# Patient Record
Sex: Male | Born: 1985 | Race: White | Hispanic: No | Marital: Married | Smoking: Never smoker
Health system: Southern US, Community
[De-identification: ages and names within clinical notes are randomized; demographics above are authoritative.]

## PROBLEM LIST (undated history)

## (undated) DIAGNOSIS — I10 Essential (primary) hypertension: Secondary | ICD-10-CM

## (undated) HISTORY — DX: Essential (primary) hypertension: I10

---

## 2008-07-28 ENCOUNTER — Ambulatory Visit: Payer: Self-pay | Admitting: Family Medicine

## 2011-09-10 ENCOUNTER — Emergency Department: Payer: Self-pay | Admitting: Emergency Medicine

## 2011-09-22 ENCOUNTER — Ambulatory Visit: Payer: Self-pay | Admitting: Unknown Physician Specialty

## 2012-10-10 IMAGING — CR DG KNEE COMPLETE 4+V*R*
1 series · 5 of 5 positions shown · non-contrast
Comparison: none

REASON FOR EXAM: pain after injury  -  ed waiting room
COMMENTS:   May transport without cardiac monitor

PROCEDURE:     DXR - DXR KNEE RT COMP WITH OBLIQUES  - September 11, 2011 [DATE]
RESULT:     Five views of the right knee reveal the bones to be adequately
mineralized. I do not see evidence of an acute fracture nor dislocation.
There is soft tissue swelling in the prepatellar region.

[Series 1: t knee ap right · 0.14mm/px · 5 of 5 slices shown]
[im 1/5]
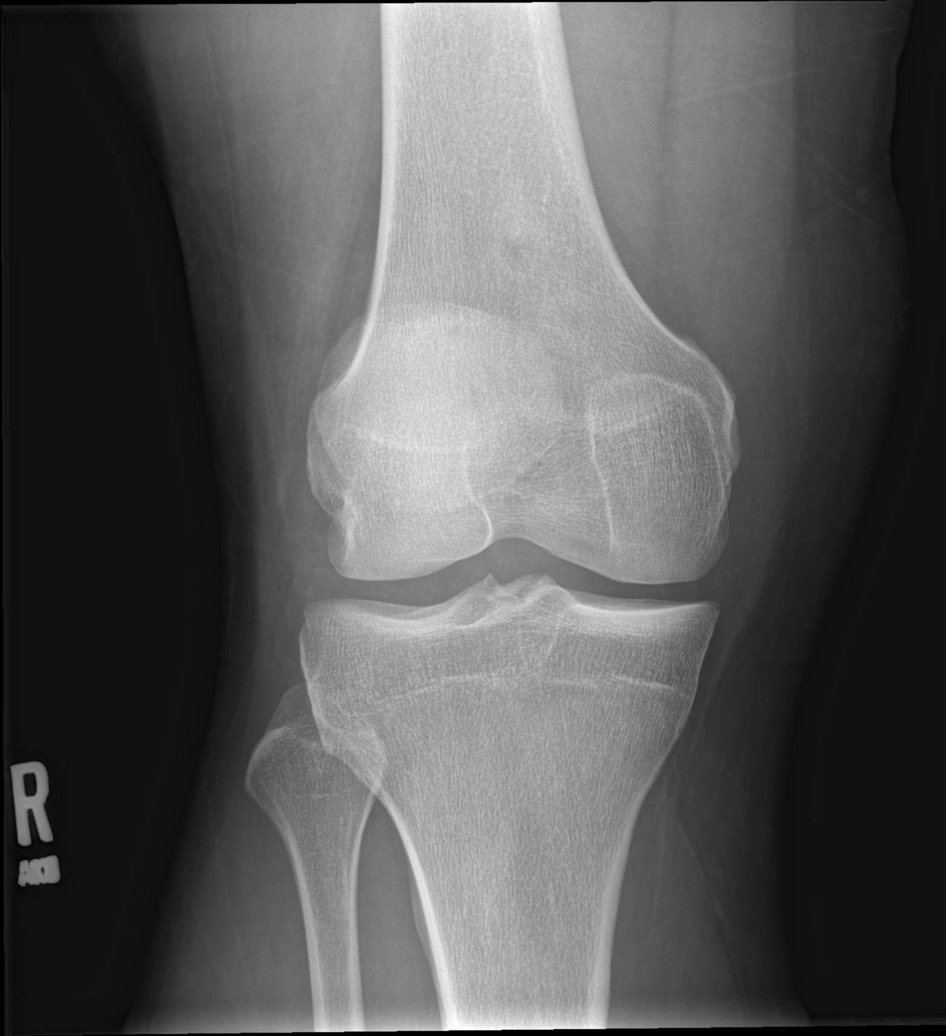
[im 2/5]
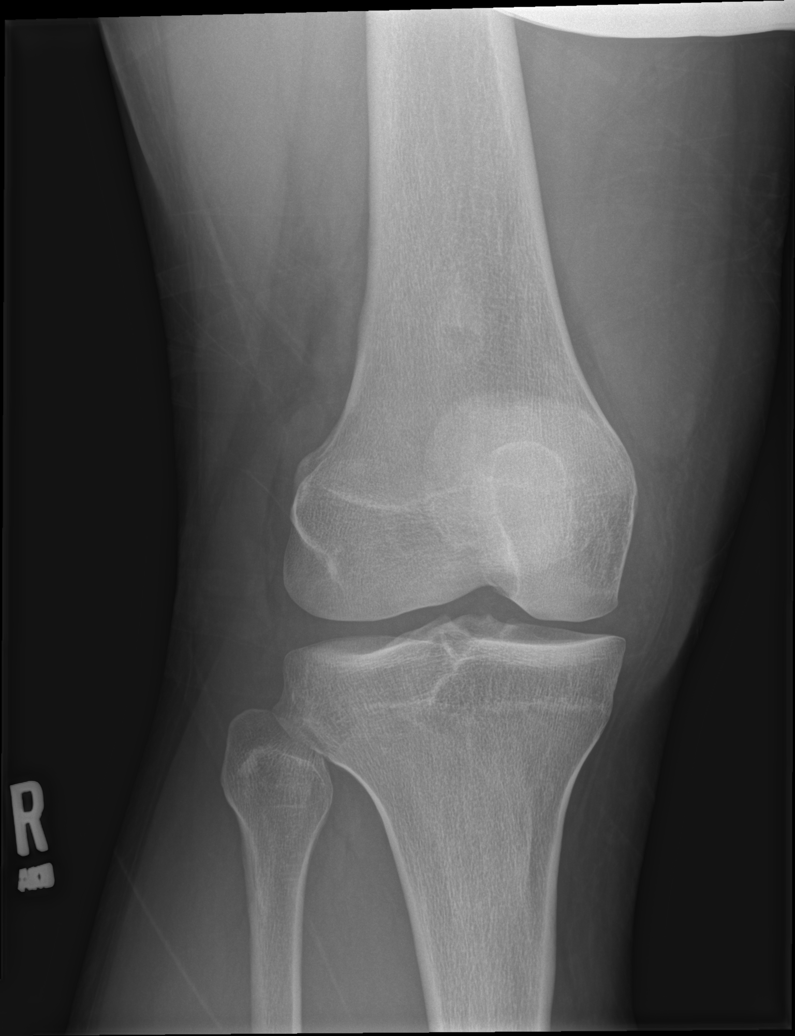
[im 3/5]
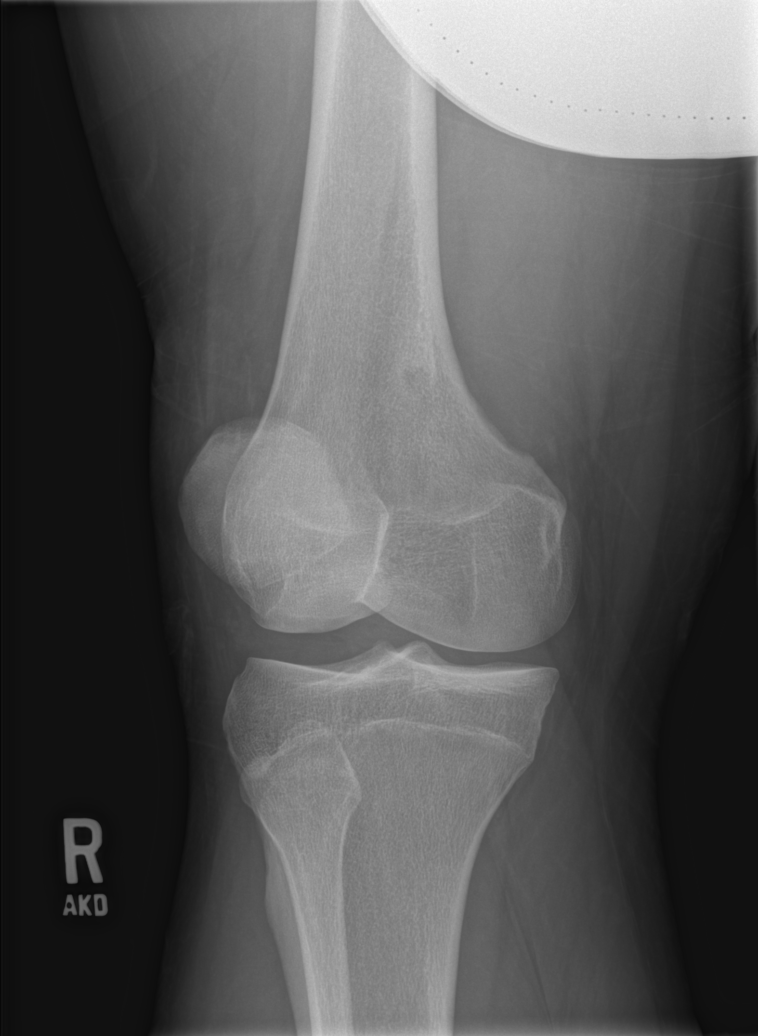
[im 4/5]
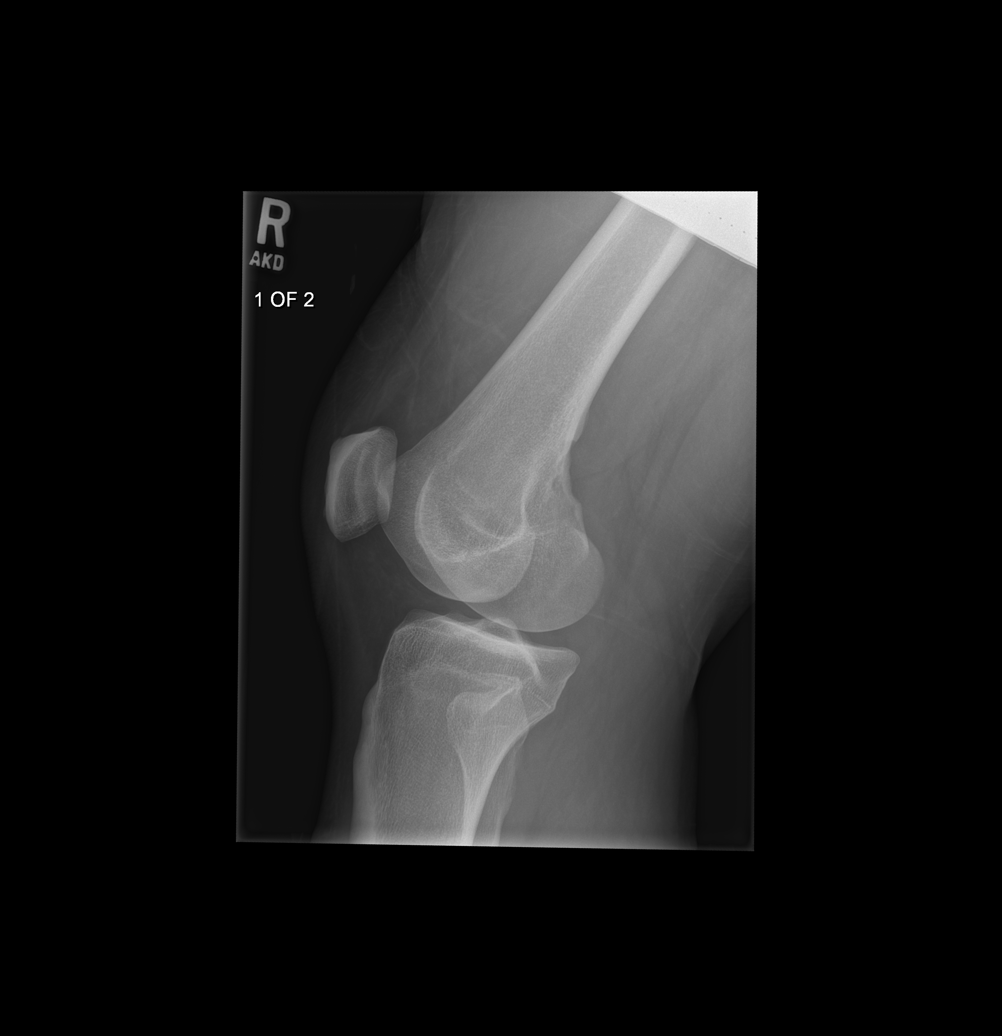
[im 5/5]
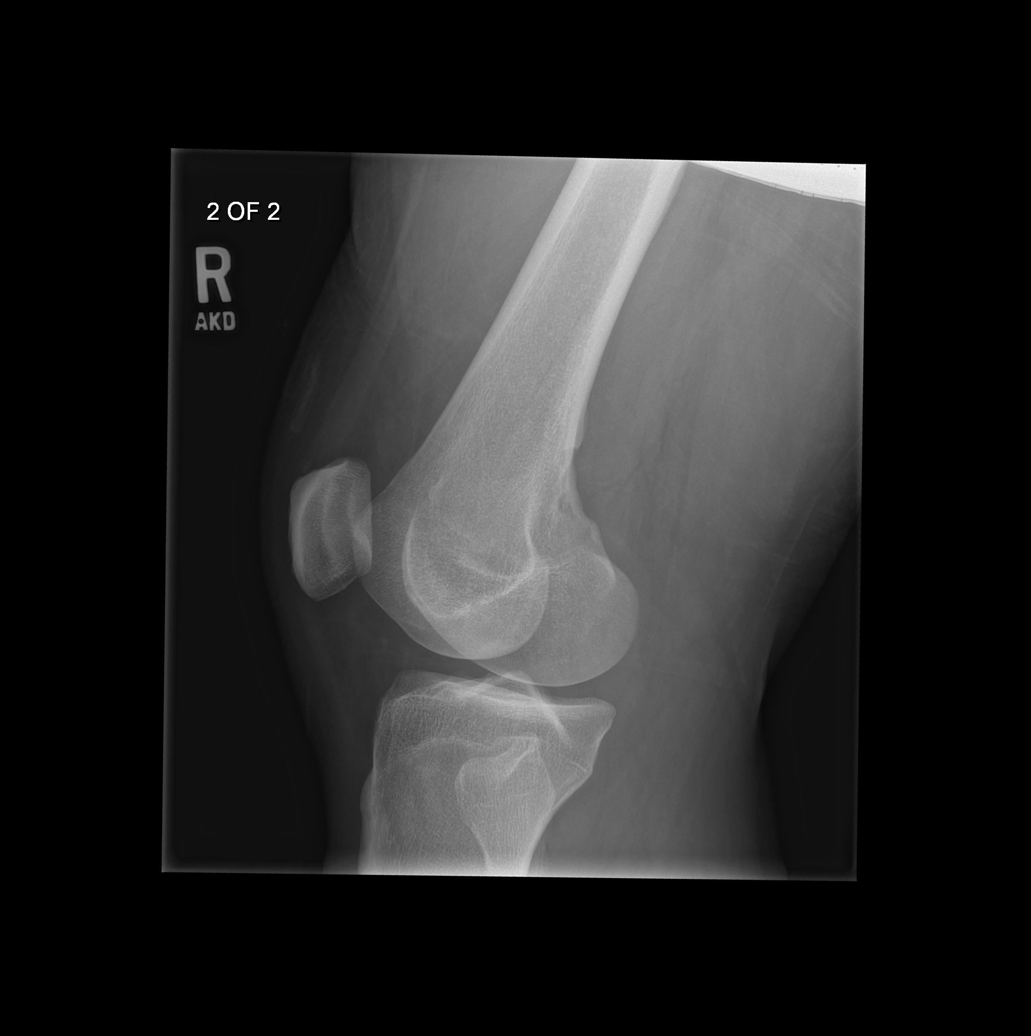

[5 of 5 positions shown; findings below may reference images not displayed]

IMPRESSION: I do not see evidence of an acute fracture nor dislocation
of the right knee.
 [REDACTED].

## 2021-02-05 DIAGNOSIS — L7211 Pilar cyst: Secondary | ICD-10-CM | POA: Diagnosis not present

## 2021-02-05 DIAGNOSIS — L730 Acne keloid: Secondary | ICD-10-CM | POA: Diagnosis not present

## 2021-08-06 DIAGNOSIS — B351 Tinea unguium: Secondary | ICD-10-CM | POA: Diagnosis not present

## 2021-08-06 DIAGNOSIS — L730 Acne keloid: Secondary | ICD-10-CM | POA: Diagnosis not present

## 2021-08-06 DIAGNOSIS — B353 Tinea pedis: Secondary | ICD-10-CM | POA: Diagnosis not present

## 2021-10-26 ENCOUNTER — Other Ambulatory Visit: Payer: Self-pay

## 2021-10-27 ENCOUNTER — Other Ambulatory Visit: Payer: Self-pay

## 2021-10-29 ENCOUNTER — Other Ambulatory Visit: Payer: Self-pay

## 2021-10-29 MED ORDER — TELMISARTAN 40 MG PO TABS
ORAL_TABLET | ORAL | 0 refills | Status: DC
  Filled 2021-10-29: qty 30, 30d supply, fill #0

## 2021-10-29 MED ORDER — CLINDAMYCIN PHOSPHATE 1 % EX SOLN
CUTANEOUS | 6 refills | Status: AC
  Filled 2021-10-29: qty 60, 20d supply, fill #0

## 2021-11-01 ENCOUNTER — Other Ambulatory Visit: Payer: Self-pay

## 2021-11-26 ENCOUNTER — Other Ambulatory Visit: Payer: Self-pay

## 2021-11-26 MED ORDER — TELMISARTAN 40 MG PO TABS
40.0000 mg | ORAL_TABLET | Freq: Every day | ORAL | 1 refills | Status: DC
  Filled 2021-11-26: qty 30, 30d supply, fill #0
  Filled 2021-12-25: qty 30, 30d supply, fill #1

## 2021-11-26 MED ORDER — HYDROCHLOROTHIAZIDE 12.5 MG PO TABS
12.5000 mg | ORAL_TABLET | Freq: Every day | ORAL | 2 refills | Status: DC
  Filled 2021-11-26: qty 30, 30d supply, fill #0
  Filled 2021-12-25: qty 30, 30d supply, fill #1

## 2021-12-26 ENCOUNTER — Other Ambulatory Visit: Payer: Self-pay

## 2021-12-27 ENCOUNTER — Other Ambulatory Visit: Payer: Self-pay

## 2022-01-21 ENCOUNTER — Other Ambulatory Visit: Payer: Self-pay

## 2022-01-23 ENCOUNTER — Other Ambulatory Visit: Payer: Self-pay

## 2022-01-24 ENCOUNTER — Other Ambulatory Visit: Payer: Self-pay

## 2022-01-25 ENCOUNTER — Other Ambulatory Visit: Payer: Self-pay

## 2022-02-01 ENCOUNTER — Other Ambulatory Visit: Payer: Self-pay

## 2022-02-03 ENCOUNTER — Other Ambulatory Visit: Payer: Self-pay

## 2022-02-04 ENCOUNTER — Other Ambulatory Visit: Payer: Self-pay

## 2022-02-09 ENCOUNTER — Other Ambulatory Visit: Payer: Self-pay

## 2022-02-21 ENCOUNTER — Other Ambulatory Visit: Payer: Self-pay

## 2022-02-27 ENCOUNTER — Other Ambulatory Visit: Payer: Self-pay

## 2022-03-01 ENCOUNTER — Other Ambulatory Visit: Payer: Self-pay

## 2022-03-08 ENCOUNTER — Other Ambulatory Visit: Payer: Self-pay

## 2022-03-09 ENCOUNTER — Other Ambulatory Visit: Payer: Self-pay

## 2022-07-20 ENCOUNTER — Ambulatory Visit: Payer: Self-pay | Admitting: Nurse Practitioner

## 2022-07-21 NOTE — Progress Notes (Signed)
BP (!) 142/96   Pulse 82   Temp 98.1 F (36.7 C) (Oral)   Resp 18   Ht 6' (1.829 m)   Wt 297 lb 8 oz (134.9 kg)   SpO2 94%   BMI 40.35 kg/m    Subjective:    Patient ID: Brett Burke, male    DOB: 01-06-1986, 37 y.o.   MRN: [id number]  HPI: Brett Burke is a 37 y.o. male  Chief Complaint  Patient presents with   Establish Care   Hypertension    Running 140/90, out of meds for 4 months   Insomnia   Headache   Establish care: his last physical was last year.  Medical history includes HTN, hyperlipidemia.  Family history includes aplastic anemia, bone marrow cancer, prostate cancer, skin cancer, dementia.  Health Maintenance due for labs.   Hypertension: His blood pressure today is 142/96.  He says that he has been getting high readings at home.  188/116, 175/125, 185/122, 197/142. He reports he has had some blurred vision, headaches, exertional shortness of breath.  He says that he has been out of blood pressure medication for three months. He says that when he is on medication he does not have those symptoms.  Will restart medication.    Hyperlipidemia: his last LDL was 182 on 10/25/2021. We are going to recheck labs.    Headache: he says that he started been having headaches. He says that it started late last year. He says initially it was after having sex.  And then he started getting them in the middle of the day.  He says he would take a BC powder and it does help.  He describes the headache as extreme and across the top of his head. He does experience light sensitivity, no nausea.  He says the headache can last a couple hours.  He says he has 2-3 in the last month. Ordered head ct.   Urinary frequency/urgency/increased thirst:  he says that since January he has had these symptoms. Will get labs. Urine dip was positive for glucose.  A1C was 11.8.  New onset diabetes: A1C was 11.8. discussed treatment options.  Patient started on ozempic 0.25 mg weekly, first dose given in  office. Will also start metformin 500 mg BID.  Ordered glucometer to monitor blood sugar three times a week.  Follow up in three months. Also discussed ways to help with lifestyle modification.   Tingling in hands: patient reports he has had tingling in bilateral hands after sleeping.  Will get labs, discussed trying wrist splints.        07/22/2022   10:07 AM  Depression screen PHQ 2/9  Decreased Interest 0  Down, Depressed, Hopeless 0  PHQ - 2 Score 0    Relevant past medical, surgical, family and social history reviewed and updated as indicated. Interim medical history since our last visit reviewed. Allergies and medications reviewed and updated.  Review of Systems  Constitutional: Negative for fever or weight change.  Respiratory: Negative for cough and shortness of breath.   Cardiovascular: Negative for chest pain or palpitations.  Gastrointestinal: Negative for abdominal pain, no bowel changes.  GU: positive urinary frequency, urgency Musculoskeletal: Negative for gait problem or joint swelling.  Skin: Negative for rash.  Neurological: Negative for dizziness , positive for  headache.  No other specific complaints in a complete review of systems (except as listed in HPI above).      Objective:    BP (!) 142/96  Pulse 82   Temp 98.1 F (36.7 C) (Oral)   Resp 18   Ht 6' (1.829 m)   Wt 297 lb 8 oz (134.9 kg)   SpO2 94%   BMI 40.35 kg/m   Wt Readings from Last 3 Encounters:  07/22/22 297 lb 8 oz (134.9 kg)    Physical Exam  Constitutional: Patient appears well-developed and well-nourished. Obese  No distress.  HEENT: head atraumatic, normocephalic, pupils equal and reactive to light, neck supple Cardiovascular: Normal rate, regular rhythm and normal heart sounds.  No murmur heard. No BLE edema. Pulmonary/Chest: Effort normal and breath sounds normal. No respiratory distress. Abdominal: Soft.  There is no tenderness. Psychiatric: Patient has a normal mood and  affect. behavior is normal. Judgment and thought content normal.     Assessment & Plan:   Problem List Items Addressed This Visit       Cardiovascular and Mediastinum   Essential hypertension    Restart telmisartan-hydrochlorothiazide 40-12.5 mg daily       Relevant Medications   telmisartan-hydrochlorothiazide (MICARDIS HCT) 40-12.5 MG tablet   Other Relevant Orders   CBC with Differential/Platelet   COMPLETE METABOLIC PANEL WITH GFR     Endocrine   New onset type 2 diabetes mellitus (HCC)    Start metformin 500 mg BID and ozempic 0.25 mg weekly.  Check blood sugar three times a week and keep log bring to next appointment. Work on lifestyle modification.       Relevant Medications   telmisartan-hydrochlorothiazide (MICARDIS HCT) 40-12.5 MG tablet   Semaglutide, 1 MG/DOSE, 4 MG/3ML SOPN (Start on 09/21/2022)   Semaglutide,0.25 or 0.5MG /DOS, (OZEMPIC, 0.25 OR 0.5 MG/DOSE,) 2 MG/3ML SOPN   metFORMIN (GLUCOPHAGE-XR) 500 MG 24 hr tablet   Blood Glucose Monitoring Suppl (BLOOD GLUCOSE MONITOR SYSTEM) w/Device KIT   Glucose Blood (BLOOD GLUCOSE TEST STRIPS) STRP   Lancet Device MISC   Lancets (FREESTYLE) lancets     Other   Mixed hyperlipidemia - Primary    Getting labs, discussed lifestyle modification      Relevant Medications   telmisartan-hydrochlorothiazide (MICARDIS HCT) 40-12.5 MG tablet   Other Relevant Orders   Lipid panel   Other Visit Diagnoses     Need for Tdap vaccination       Relevant Orders   Tdap vaccine greater than or equal to 7yo IM (Completed)   Encounter to establish care       schedule cpe   Screening for diabetes mellitus       Relevant Orders   COMPLETE METABOLIC PANEL WITH GFR   POCT HgB A1C (Completed)   Screening for HIV without presence of risk factors       Relevant Orders   HIV Antibody (routine testing w rflx)   Encounter for hepatitis C screening test for low risk patient       Relevant Orders   Hepatitis C antibody   Headache  associated with sexual activity       ordered head ct   Relevant Orders   CT HEAD WO CONTRAST (5MM)   Paresthesia of both hands       getting labs, can use wrist splints at bed time   Relevant Orders   Vitamin B12   Increased frequency of urination       getting labs, A1C was 11.8, new onset diabetes   Relevant Orders   COMPLETE METABOLIC PANEL WITH GFR   POCT urinalysis dipstick (Completed)        Follow up plan:  Return in about 3 months (around 10/22/2022) for follow up.

## 2022-07-22 ENCOUNTER — Ambulatory Visit: Payer: Commercial Managed Care - PPO | Admitting: Nurse Practitioner

## 2022-07-22 ENCOUNTER — Encounter: Payer: Self-pay | Admitting: Nurse Practitioner

## 2022-07-22 ENCOUNTER — Other Ambulatory Visit: Payer: Self-pay

## 2022-07-22 VITALS — BP 142/96 | HR 82 | Temp 98.1°F | Resp 18 | Ht 72.0 in | Wt 297.5 lb

## 2022-07-22 DIAGNOSIS — R202 Paresthesia of skin: Secondary | ICD-10-CM

## 2022-07-22 DIAGNOSIS — I10 Essential (primary) hypertension: Secondary | ICD-10-CM | POA: Diagnosis not present

## 2022-07-22 DIAGNOSIS — Z114 Encounter for screening for human immunodeficiency virus [HIV]: Secondary | ICD-10-CM

## 2022-07-22 DIAGNOSIS — R350 Frequency of micturition: Secondary | ICD-10-CM | POA: Diagnosis not present

## 2022-07-22 DIAGNOSIS — Z7689 Persons encountering health services in other specified circumstances: Secondary | ICD-10-CM

## 2022-07-22 DIAGNOSIS — G4482 Headache associated with sexual activity: Secondary | ICD-10-CM

## 2022-07-22 DIAGNOSIS — E1165 Type 2 diabetes mellitus with hyperglycemia: Secondary | ICD-10-CM | POA: Insufficient documentation

## 2022-07-22 DIAGNOSIS — E782 Mixed hyperlipidemia: Secondary | ICD-10-CM

## 2022-07-22 DIAGNOSIS — Z1159 Encounter for screening for other viral diseases: Secondary | ICD-10-CM | POA: Diagnosis not present

## 2022-07-22 DIAGNOSIS — Z23 Encounter for immunization: Secondary | ICD-10-CM | POA: Diagnosis not present

## 2022-07-22 DIAGNOSIS — Z131 Encounter for screening for diabetes mellitus: Secondary | ICD-10-CM | POA: Diagnosis not present

## 2022-07-22 DIAGNOSIS — E119 Type 2 diabetes mellitus without complications: Secondary | ICD-10-CM | POA: Insufficient documentation

## 2022-07-22 LAB — POCT URINALYSIS DIPSTICK
Bilirubin, UA: NEGATIVE
Blood, UA: NEGATIVE
Glucose, UA: POSITIVE — AB
Leukocytes, UA: NEGATIVE
Nitrite, UA: NEGATIVE
Protein, UA: NEGATIVE
Spec Grav, UA: 1.02 (ref 1.010–1.025)
Urobilinogen, UA: 0.2 E.U./dL
pH, UA: 5 (ref 5.0–8.0)

## 2022-07-22 LAB — POCT GLYCOSYLATED HEMOGLOBIN (HGB A1C): Hemoglobin A1C: 11.8 % — AB (ref 4.0–5.6)

## 2022-07-22 MED ORDER — BLOOD GLUCOSE MONITOR SYSTEM W/DEVICE KIT
1.0000 | PACK | Freq: Three times a day (TID) | 0 refills | Status: AC
  Filled 2022-07-22: qty 1, 1d supply, fill #0

## 2022-07-22 MED ORDER — TELMISARTAN-HCTZ 40-12.5 MG PO TABS
1.0000 | ORAL_TABLET | Freq: Every day | ORAL | 1 refills | Status: DC
  Filled 2022-07-22 (×2): qty 90, 90d supply, fill #0
  Filled 2022-10-11: qty 90, 90d supply, fill #1

## 2022-07-22 MED ORDER — LANCET DEVICE MISC
1.0000 | Freq: Three times a day (TID) | 0 refills | Status: AC
  Filled 2022-07-22: qty 1, 30d supply, fill #0

## 2022-07-22 MED ORDER — SEMAGLUTIDE (1 MG/DOSE) 4 MG/3ML ~~LOC~~ SOPN
1.0000 mg | PEN_INJECTOR | SUBCUTANEOUS | 0 refills | Status: DC
  Filled 2022-07-22 – 2022-09-21 (×2): qty 3, 28d supply, fill #0

## 2022-07-22 MED ORDER — METFORMIN HCL ER 500 MG PO TB24
500.0000 mg | ORAL_TABLET | Freq: Two times a day (BID) | ORAL | 0 refills | Status: DC
  Filled 2022-07-22: qty 180, 90d supply, fill #0

## 2022-07-22 MED ORDER — BLOOD GLUCOSE TEST VI STRP
1.0000 | ORAL_STRIP | 0 refills | Status: AC
  Filled 2022-07-22: qty 100, 90d supply, fill #0

## 2022-07-22 MED ORDER — OZEMPIC (0.25 OR 0.5 MG/DOSE) 2 MG/3ML ~~LOC~~ SOPN
0.5000 mg | PEN_INJECTOR | SUBCUTANEOUS | 0 refills | Status: DC
  Filled 2022-07-22: qty 3, 28d supply, fill #0

## 2022-07-22 MED ORDER — FREESTYLE LANCETS MISC
1.0000 | Freq: Three times a day (TID) | 0 refills | Status: AC
  Filled 2022-07-22: qty 100, 90d supply, fill #0
  Filled 2022-07-22: qty 100, 30d supply, fill #0

## 2022-07-22 NOTE — Assessment & Plan Note (Signed)
Getting labs, discussed lifestyle modification

## 2022-07-22 NOTE — Assessment & Plan Note (Signed)
Restart telmisartan-hydrochlorothiazide 40-12.5 mg daily

## 2022-07-22 NOTE — Assessment & Plan Note (Signed)
Start metformin 500 mg BID and ozempic 0.25 mg weekly.  Check blood sugar three times a week and keep log bring to next appointment. Work on lifestyle modification.

## 2022-07-25 LAB — COMPLETE METABOLIC PANEL WITH GFR
AG Ratio: 1.6 (calc) (ref 1.0–2.5)
ALT: 119 U/L — ABNORMAL HIGH (ref 9–46)
AST: 54 U/L — ABNORMAL HIGH (ref 10–40)
Albumin: 4.7 g/dL (ref 3.6–5.1)
Alkaline phosphatase (APISO): 99 U/L (ref 36–130)
BUN: 17 mg/dL (ref 7–25)
CO2: 24 mmol/L (ref 20–32)
Calcium: 9.5 mg/dL (ref 8.6–10.3)
Chloride: 101 mmol/L (ref 98–110)
Creat: 1.02 mg/dL (ref 0.60–1.26)
Globulin: 3 g/dL (calc) (ref 1.9–3.7)
Glucose, Bld: 325 mg/dL — ABNORMAL HIGH (ref 65–99)
Potassium: 3.9 mmol/L (ref 3.5–5.3)
Sodium: 139 mmol/L (ref 135–146)
Total Bilirubin: 0.8 mg/dL (ref 0.2–1.2)
Total Protein: 7.7 g/dL (ref 6.1–8.1)
eGFR: 98 mL/min/{1.73_m2} (ref >=60)

## 2022-07-25 LAB — CBC WITH DIFFERENTIAL/PLATELET
Absolute Monocytes: 282 cells/uL (ref 200–950)
Basophils Absolute: 32 cells/uL (ref 0–200)
Basophils Relative: 0.5 %
Eosinophils Absolute: 122 cells/uL (ref 15–500)
Eosinophils Relative: 1.9 %
HCT: 50.6 % — ABNORMAL HIGH (ref 38.5–50.0)
Hemoglobin: 17.8 g/dL — ABNORMAL HIGH (ref 13.2–17.1)
Lymphs Abs: 2528 cells/uL (ref 850–3900)
MCH: 31.6 pg (ref 27.0–33.0)
MCHC: 35.2 g/dL (ref 32.0–36.0)
MCV: 89.7 fL (ref 80.0–100.0)
MPV: 10.8 fL (ref 7.5–12.5)
Monocytes Relative: 4.4 %
Neutro Abs: 3437 cells/uL (ref 1500–7800)
Neutrophils Relative %: 53.7 %
Platelets: 206 10*3/uL (ref 140–400)
RBC: 5.64 10*6/uL (ref 4.20–5.80)
RDW: 12.2 % (ref 11.0–15.0)
Total Lymphocyte: 39.5 %
WBC: 6.4 10*3/uL (ref 3.8–10.8)

## 2022-07-25 LAB — LIPID PANEL
Cholesterol: 263 mg/dL — ABNORMAL HIGH (ref <200)
HDL: 34 mg/dL — ABNORMAL LOW (ref >=40)
LDL Cholesterol (Calc): 184 mg/dL (calc) — ABNORMAL HIGH
Non-HDL Cholesterol (Calc): 229 mg/dL (calc) — ABNORMAL HIGH (ref <130)
Total CHOL/HDL Ratio: 7.7 (calc) — ABNORMAL HIGH (ref <5.0)
Triglycerides: 254 mg/dL — ABNORMAL HIGH (ref <150)

## 2022-07-25 LAB — HIV ANTIBODY (ROUTINE TESTING W REFLEX): HIV 1&2 Ab, 4th Generation: NONREACTIVE

## 2022-07-25 LAB — HEPATITIS C ANTIBODY: Hepatitis C Ab: NONREACTIVE

## 2022-07-25 LAB — VITAMIN B12: Vitamin B-12: 552 pg/mL (ref 200–1100)

## 2022-08-02 ENCOUNTER — Ambulatory Visit
Admission: RE | Admit: 2022-08-02 | Discharge: 2022-08-02 | Disposition: A | Discharge status: Home / Self Care | Payer: Commercial Managed Care - PPO | Source: Ambulatory Visit | Attending: Nurse Practitioner | Admitting: Nurse Practitioner

## 2022-08-02 DIAGNOSIS — R519 Headache, unspecified: Secondary | ICD-10-CM | POA: Diagnosis not present

## 2022-08-02 DIAGNOSIS — G4482 Headache associated with sexual activity: Secondary | ICD-10-CM

## 2022-09-21 ENCOUNTER — Other Ambulatory Visit: Payer: Self-pay

## 2022-10-11 ENCOUNTER — Other Ambulatory Visit: Payer: Self-pay

## 2022-10-11 ENCOUNTER — Other Ambulatory Visit: Payer: Self-pay | Admitting: Nurse Practitioner

## 2022-10-11 DIAGNOSIS — E119 Type 2 diabetes mellitus without complications: Secondary | ICD-10-CM

## 2022-10-11 MED FILL — Metformin HCl Tab ER 24HR 500 MG: ORAL | 90 days supply | Qty: 180 | Fill #0 | Status: AC

## 2022-10-11 MED FILL — Semaglutide Soln Pen-inj 1 MG/DOSE (4 MG/3ML): SUBCUTANEOUS | 28 days supply | Qty: 3 | Fill #0 | Status: AC

## 2022-10-11 NOTE — Telephone Encounter (Signed)
Appt- 10/31/22- will run out of medication before- so RF granted Requested Prescriptions  Pending Prescriptions Disp Refills   metFORMIN (GLUCOPHAGE-XR) 500 MG 24 hr tablet 180 tablet 0    Sig: Take 1 tablet (500 mg total) by mouth 2 (two) times daily with a meal.     Endocrinology:  Diabetes - Biguanides Failed - 10/11/2022 10:44 AM      Failed - HBA1C is between 0 and 7.9 and within 180 days    Hemoglobin A1C  Date Value Ref Range Status  07/22/2022 11.8 (A) 4.0 - 5.6 % Final         Passed - Cr in normal range and within 360 days    Creat  Date Value Ref Range Status  07/22/2022 1.02 0.60 - 1.26 mg/dL Final         Passed - eGFR in normal range and within 360 days    eGFR  Date Value Ref Range Status  07/22/2022 98 > OR = 60 mL/min/1.60m2 Final         Passed - B12 Level in normal range and within 720 days    Vitamin B-12  Date Value Ref Range Status  07/22/2022 552 200 - 1,100 pg/mL Final         Passed - Valid encounter within last 6 months    Recent Outpatient Visits           2 months ago Mixed hyperlipidemia   [facility] Berniece Salines, FNP       Future Appointments             In 2 weeks Berniece Salines, FNP [facility], [facility]            Passed - CBC within normal limits and completed in the last 12 months    WBC  Date Value Ref Range Status  07/22/2022 6.4 3.8 - 10.8 Thousand/uL Final   RBC  Date Value Ref Range Status  07/22/2022 5.64 4.20 - 5.80 Million/uL Final   Hemoglobin  Date Value Ref Range Status  07/22/2022 17.8 (H) 13.2 - 17.1 g/dL Final   HCT  Date Value Ref Range Status  07/22/2022 50.6 (H) 38.5 - 50.0 % Final   MCHC  Date Value Ref Range Status  07/22/2022 35.2 32.0 - 36.0 g/dL Final   [facility]  Date Value Ref Range Status  07/22/2022 31.6 27.0 - 33.0 pg Final   MCV  Date Value Ref Range Status  07/22/2022 89.7 80.0 - 100.0 fL Final   No results found for:  "PLTCOUNTKUC", "LABPLAT", "POCPLA" RDW  Date Value Ref Range Status  07/22/2022 12.2 11.0 - 15.0 % Final          OZEMPIC, 1 MG/DOSE, 4 MG/3ML SOPN [Pharmacy Med Name: Semaglutide, 1 MG/DOSE, 4 MG/3ML Solution Pen-injector] 3 mL 0    Sig: Inject 1 mg as directed once a week.     Endocrinology:  Diabetes - GLP-1 Receptor Agonists - semaglutide Failed - 10/11/2022 10:44 AM      Failed - HBA1C in normal range and within 180 days    Hemoglobin A1C  Date Value Ref Range Status  07/22/2022 11.8 (A) 4.0 - 5.6 % Final         Passed - Cr in normal range and within 360 days    Creat  Date Value Ref Range Status  07/22/2022 1.02 0.60 - 1.26 mg/dL Final  Passed - Valid encounter within last 6 months    Recent Outpatient Visits           2 months ago Mixed hyperlipidemia   [facility] Berniece Salines, FNP       Future Appointments             In 2 weeks Zane Herald, Rudolpho Sevin, FNP [facility], [facility]

## 2022-10-13 ENCOUNTER — Other Ambulatory Visit: Payer: Self-pay

## 2022-10-14 ENCOUNTER — Other Ambulatory Visit: Payer: Self-pay

## 2022-10-25 ENCOUNTER — Ambulatory Visit: Payer: Commercial Managed Care - PPO | Admitting: Nurse Practitioner

## 2022-10-30 NOTE — Progress Notes (Signed)
BP 124/76   Temp 98.1 F (36.7 C) (Oral)   Resp 16   Ht 6' (1.829 m)   Wt 283 lb 14.4 oz (128.8 kg)   SpO2 98%   BMI 38.50 kg/m    Subjective:    Patient ID: Brett Burke, male    DOB: March 28, 1986, 37 y.o.   MRN: [id number]  HPI: Brett Burke is a 37 y.o. male  Chief Complaint  Patient presents with   Diabetes   Hypertension    3 month follow up   Hyperlipidemia   Hypertension:  -Medications: telmisartan-hydrochlorothiazide 40-12.5 mg daily -Patient is compliant with above medications and reports no side effects. -Checking BP at home (average): 120s -130s/70s -Denies any SOB, CP, vision changes, LE edema or symptoms of hypotension -Diet: work on reducing sodium in diet -Exercise: increase physical activity as tolerated.    HLD:  -Medications: not currently on medications, will get labs at next follow up appointment, discussed we may need to start cholesterol lowering medication. -Patient is working on lifestyle modification -Last lipid panel:  Lipid Panel     Component Value Date/Time   CHOL 263 (H) 07/22/2022 1103   TRIG 254 (H) 07/22/2022 1103   HDL 34 (L) 07/22/2022 1103   CHOLHDL 7.7 (H) 07/22/2022 1103   LDLCALC 184 (H) 07/22/2022 1103      Headache: last appointment he reported having headaches. He says that it started late last year. He says initially it was after having sex.  And then he started getting them in the middle of the day.  He says he would take a BC powder and it does help.  He described the headache as extreme and across the top of his head. He does experience light sensitivity, no nausea.  He says the headache can last a couple hours.  He says he has 2-3 in the last month. Ordered head ct which was negative. Patient reports today that headaches have resolved.    Diabetes, Type 2:  -Last A1c 11.8, today 6.4 -Medications: ozempic, metformin 500 mg BID -Patient is compliant with the above medications and reports no side effects.   -Checking BG at home: 100-130 -Highest home BG since last visit: 130 -Lowest home BG since last visit: 97 -Diet: he says that he has been working on his diet -Exercise: he plays a little soft ball -Eye exam: due -Foot exam: due -Microalbumin: due -Statin: not currently -PNA vaccine: not current -Denies symptoms of hypoglycemia, polyuria, polydipsia, numbness extremities, foot ulcers/trauma.     Tingling in hands: patient reports he has had tingling in bilateral hands after sleeping.  He says he did not try the wrist splints.  Encouraged to do so. We did check labs and B12 was low end normal.  Recommend starting b12 supplement and wearing wrist splints at night. If no improvement can send to neuro for nerve testing.   Obesity:  Current weight : 283 lbs BMI: 38.50 Highest weight:297 lbs Treatment Tried: lifestyle modification, currently on ozempic Comorbidities: DM, HTN, HLD   Depression/anxiety: patient reports that it has been bothering him for some time but that he has never been on medications before, but reports it has started to affect his life. He says that he gets agitated easily and takes it out on others.  He denies any SI.  Discussed trying medication and therapy.  Will provide list of therapists in the area and start on lexapro.   Medication not currently on meds, has never been on  meds Compliant not currently on meds Side effects no currently on meds PHQ9 positive GAD positive Therapy not currently     10/31/2022    7:59 AM 10/31/2022    7:41 AM 07/22/2022   10:07 AM  Depression screen PHQ 2/9  Decreased Interest 2 0 0  Down, Depressed, Hopeless 2 0 0  PHQ - 2 Score 4 0 0  Altered sleeping 3    Tired, decreased energy 3    Change in appetite 1    Feeling bad or failure about yourself  3    Trouble concentrating 3    Moving slowly or fidgety/restless 1    Suicidal thoughts 1    PHQ-9 Score 19    Difficult doing work/chores Very difficult      Relevant past  medical, surgical, family and social history reviewed and updated as indicated. Interim medical history since our last visit reviewed. Allergies and medications reviewed and updated.  Review of Systems  Constitutional: Negative for fever or weight change.  Respiratory: Negative for cough and shortness of breath.   Cardiovascular: Negative for chest pain or palpitations.  Gastrointestinal: Negative for abdominal pain, no bowel changes.  Musculoskeletal: Negative for gait problem or joint swelling.  Skin: Negative for rash.  Neurological: Negative for dizziness and   headache.  No other specific complaints in a complete review of systems (except as listed in HPI above).      Objective:    BP 124/76   Temp 98.1 F (36.7 C) (Oral)   Resp 16   Ht 6' (1.829 m)   Wt 283 lb 14.4 oz (128.8 kg)   SpO2 98%   BMI 38.50 kg/m   Wt Readings from Last 3 Encounters:  10/31/22 283 lb 14.4 oz (128.8 kg)  07/22/22 297 lb 8 oz (134.9 kg)    Physical Exam  Constitutional: Patient appears well-developed and well-nourished. Obese  No distress.  HEENT: head atraumatic, normocephalic, pupils equal and reactive to light, neck supple Cardiovascular: Normal rate, regular rhythm and normal heart sounds.  No murmur heard. No BLE edema. Pulmonary/Chest: Effort normal and breath sounds normal. No respiratory distress. Abdominal: Soft.  There is no tenderness. Psychiatric: Patient has a normal mood and affect. behavior is normal. Judgment and thought content normal.     Assessment & Plan:   Problem List Items Addressed This Visit       Cardiovascular and Mediastinum   Essential hypertension    Continue taking telmisartan-hydrochlorothiazide 40-12.5 mg daily.  Recommend decreasing sodium in diet.        Endocrine   New onset type 2 diabetes mellitus (HCC)    Continue Ozempic and metformin 500 mg twice daily.  Doing well with A1c has improved to 6.4.        Other   Mixed hyperlipidemia - Primary     Recheck at next follow-up appointment.  80 working on lifestyle modification.      Class 3 severe obesity due to excess calories with serious comorbidity and body mass index (BMI) of 40.0 to 44.9 in adult [facility])    Continue working on lifestyle modification.  He is also currently on Ozempic for diabetes.      Moderate episode of recurrent major depressive disorder (HCC)    Start Lexapro 10 mg daily.  Follow-up in 4 weeks.      Relevant Medications   escitalopram (LEXAPRO) 10 MG tablet   GAD (generalized anxiety disorder)    Start Lexapro 10 mg daily.  Follow-up  in 4 weeks.      Relevant Medications   escitalopram (LEXAPRO) 10 MG tablet   Other Visit Diagnoses     Headache associated with sexual activity       resolved   Relevant Medications   escitalopram (LEXAPRO) 10 MG tablet   Paresthesia of both hands       will start b12 supplement and try wrist splints if no improvement will refer to neuro        Follow up plan: Return in about 4 weeks (around 11/28/2022) for follow up.

## 2022-10-31 ENCOUNTER — Other Ambulatory Visit: Payer: Self-pay

## 2022-10-31 ENCOUNTER — Ambulatory Visit (INDEPENDENT_AMBULATORY_CARE_PROVIDER_SITE_OTHER): Payer: Commercial Managed Care - PPO | Admitting: Nurse Practitioner

## 2022-10-31 ENCOUNTER — Encounter: Payer: Self-pay | Admitting: Nurse Practitioner

## 2022-10-31 VITALS — BP 124/76 | Temp 98.1°F | Resp 16 | Ht 72.0 in | Wt 283.9 lb

## 2022-10-31 DIAGNOSIS — R202 Paresthesia of skin: Secondary | ICD-10-CM | POA: Diagnosis not present

## 2022-10-31 DIAGNOSIS — F411 Generalized anxiety disorder: Secondary | ICD-10-CM

## 2022-10-31 DIAGNOSIS — F331 Major depressive disorder, recurrent, moderate: Secondary | ICD-10-CM

## 2022-10-31 DIAGNOSIS — Z7984 Long term (current) use of oral hypoglycemic drugs: Secondary | ICD-10-CM | POA: Diagnosis not present

## 2022-10-31 DIAGNOSIS — E1165 Type 2 diabetes mellitus with hyperglycemia: Secondary | ICD-10-CM | POA: Diagnosis not present

## 2022-10-31 DIAGNOSIS — Z6841 Body Mass Index (BMI) 40.0 and over, adult: Secondary | ICD-10-CM

## 2022-10-31 DIAGNOSIS — E782 Mixed hyperlipidemia: Secondary | ICD-10-CM

## 2022-10-31 DIAGNOSIS — Z7985 Long-term (current) use of injectable non-insulin antidiabetic drugs: Secondary | ICD-10-CM | POA: Diagnosis not present

## 2022-10-31 DIAGNOSIS — I10 Essential (primary) hypertension: Secondary | ICD-10-CM

## 2022-10-31 DIAGNOSIS — E66813 Obesity, class 3: Secondary | ICD-10-CM

## 2022-10-31 DIAGNOSIS — G4482 Headache associated with sexual activity: Secondary | ICD-10-CM | POA: Diagnosis not present

## 2022-10-31 LAB — POCT GLYCOSYLATED HEMOGLOBIN (HGB A1C): Hemoglobin A1C: 6.4 % — AB (ref 4.0–5.6)

## 2022-10-31 MED ORDER — ESCITALOPRAM OXALATE 10 MG PO TABS
10.0000 mg | ORAL_TABLET | Freq: Every day | ORAL | 0 refills | Status: DC
Start: 2022-10-31 — End: 2022-11-29
  Filled 2022-10-31: qty 30, 30d supply, fill #0

## 2022-10-31 NOTE — Assessment & Plan Note (Signed)
Continue working on lifestyle modification.  He is also currently on Ozempic for diabetes.

## 2022-10-31 NOTE — Assessment & Plan Note (Signed)
Start Lexapro 10 mg daily.  Follow-up in 4 weeks.

## 2022-10-31 NOTE — Assessment & Plan Note (Signed)
Continue Ozempic and metformin 500 mg twice daily.  Doing well with A1c has improved to 6.4.

## 2022-10-31 NOTE — Assessment & Plan Note (Signed)
Start Lexapro 10 mg daily.  Follow-up in 4 weeks. 

## 2022-10-31 NOTE — Assessment & Plan Note (Signed)
Continue taking telmisartan-hydrochlorothiazide 40-12.5 mg daily.  Recommend decreasing sodium in diet.

## 2022-10-31 NOTE — Assessment & Plan Note (Signed)
Recheck at next follow-up appointment.  80 working on lifestyle modification.

## 2022-11-01 LAB — MICROALBUMIN / CREATININE URINE RATIO
Creatinine, Urine: 298 mg/dL (ref 20–320)
Microalb Creat Ratio: 3 mg/g creat (ref <30)
Microalb, Ur: 1 mg/dL

## 2022-11-11 ENCOUNTER — Other Ambulatory Visit: Payer: Self-pay | Admitting: Nurse Practitioner

## 2022-11-11 ENCOUNTER — Other Ambulatory Visit: Payer: Self-pay

## 2022-11-11 DIAGNOSIS — E119 Type 2 diabetes mellitus without complications: Secondary | ICD-10-CM

## 2022-11-11 MED ORDER — OZEMPIC (1 MG/DOSE) 4 MG/3ML ~~LOC~~ SOPN
1.0000 mg | PEN_INJECTOR | SUBCUTANEOUS | 0 refills | Status: DC
Start: 2022-11-11 — End: 2022-12-12
  Filled 2022-11-11: qty 3, 28d supply, fill #0

## 2022-11-11 NOTE — Telephone Encounter (Signed)
Requested Prescriptions  Pending Prescriptions Disp Refills   Semaglutide, 1 MG/DOSE, (OZEMPIC, 1 MG/DOSE,) 4 MG/3ML SOPN 3 mL 0    Sig: Inject 1 mg into the skin once a week.     Endocrinology:  Diabetes - GLP-1 Receptor Agonists - semaglutide Failed - 11/11/2022 12:52 PM      Failed - HBA1C in normal range and within 180 days    Hemoglobin A1C  Date Value Ref Range Status  10/31/2022 6.4 (A) 4.0 - 5.6 % Final         Passed - Cr in normal range and within 360 days    Creat  Date Value Ref Range Status  07/22/2022 1.02 0.60 - 1.26 mg/dL Final   Creatinine, Urine  Date Value Ref Range Status  10/31/2022 298 20 - 320 mg/dL Final         Passed - Valid encounter within last 6 months    Recent Outpatient Visits           1 week ago Mixed hyperlipidemia   [facility] Berniece Salines, FNP   3 months ago Mixed hyperlipidemia   [facility] Berniece Salines, FNP       Future Appointments             In 2 weeks Zane Herald, Rudolpho Sevin, FNP [facility], [facility]

## 2022-11-28 NOTE — Progress Notes (Unsigned)
There were no vitals taken for this visit.   Subjective:    Patient ID: Brett Burke, male    DOB: Nov 19, 1985, 37 y.o.   MRN: [id number]  HPI: Brett Burke is a 37 y.o. male  No chief complaint on file.  Hypertension:  -Medications: telmisartan-hydrochlorothiazide 40-12.5 mg daily -Patient is compliant with above medications and reports no side effects. -Checking BP at home (average): 120s -130s/70s -Denies any SOB, CP, vision changes, LE edema or symptoms of hypotension -Diet: work on reducing sodium in diet -Exercise: increase physical activity as tolerated.    HLD:  -Medications: not currently on medications, will get labs at next follow up appointment, discussed we may need to start cholesterol lowering medication. -Patient is working on lifestyle modification -Last lipid panel:  Lipid Panel     Component Value Date/Time   CHOL 263 (H) 07/22/2022 1103   TRIG 254 (H) 07/22/2022 1103   HDL 34 (L) 07/22/2022 1103   CHOLHDL 7.7 (H) 07/22/2022 1103   LDLCALC 184 (H) 07/22/2022 1103      Headache: last appointment he reported having headaches. He says that it started late last year. He says initially it was after having sex.  And then he started getting them in the middle of the day.  He says he would take a BC powder and it does help.  He described the headache as extreme and across the top of his head. He does experience light sensitivity, no nausea.  He says the headache can last a couple hours.  He says he has 2-3 in the last month. Ordered head ct which was negative. Patient reports today that headaches have resolved.    Diabetes, Type 2:  -Last A1c 11.8, today 6.4 -Medications: ozempic, metformin 500 mg BID -Patient is compliant with the above medications and reports no side effects.  -Checking BG at home: 100-130 -Highest home BG since last visit: 130 -Lowest home BG since last visit: 97 -Diet: he says that he has been working on his diet -Exercise: he plays  a little soft ball -Eye exam: due -Foot exam: due -Microalbumin: due -Statin: not currently -PNA vaccine: not current -Denies symptoms of hypoglycemia, polyuria, polydipsia, numbness extremities, foot ulcers/trauma.     Tingling in hands: patient reports he has had tingling in bilateral hands after sleeping.  He says he did not try the wrist splints.  Encouraged to do so. We did check labs and B12 was low end normal.  Recommend starting b12 supplement and wearing wrist splints at night. If no improvement can send to neuro for nerve testing.   Obesity:  Current weight : 283 lbs BMI: 38.50 Highest weight:297 lbs Treatment Tried: lifestyle modification, currently on ozempic Comorbidities: DM, HTN, HLD   Depression/anxiety: patient reports that it has been bothering him for some time but that he has never been on medications before, but reports it has started to affect his life. He says that he gets agitated easily and takes it out on others.  He denies any SI.  Discussed trying medication and therapy.  Will provide list of therapists in the area and start on lexapro.   Medication not currently on meds, has never been on meds Compliant not currently on meds Side effects no currently on meds PHQ9 positive GAD positive Therapy not currently     10/31/2022    7:59 AM 10/31/2022    7:41 AM 07/22/2022   10:07 AM  Depression screen PHQ 2/9  Decreased Interest 2  0 0  Down, Depressed, Hopeless 2 0 0  PHQ - 2 Score 4 0 0  Altered sleeping 3    Tired, decreased energy 3    Change in appetite 1    Feeling bad or failure about yourself  3    Trouble concentrating 3    Moving slowly or fidgety/restless 1    Suicidal thoughts 1    PHQ-9 Score 19    Difficult doing work/chores Very difficult      Relevant past medical, surgical, family and social history reviewed and updated as indicated. Interim medical history since our last visit reviewed. Allergies and medications reviewed and  updated.  Review of Systems  Constitutional: Negative for fever or weight change.  Respiratory: Negative for cough and shortness of breath.   Cardiovascular: Negative for chest pain or palpitations.  Gastrointestinal: Negative for abdominal pain, no bowel changes.  Musculoskeletal: Negative for gait problem or joint swelling.  Skin: Negative for rash.  Neurological: Negative for dizziness and   headache.  No other specific complaints in a complete review of systems (except as listed in HPI above).      Objective:    There were no vitals taken for this visit.  Wt Readings from Last 3 Encounters:  10/31/22 283 lb 14.4 oz (128.8 kg)  07/22/22 297 lb 8 oz (134.9 kg)    Physical Exam  Constitutional: Patient appears well-developed and well-nourished. Obese  No distress.  HEENT: head atraumatic, normocephalic, pupils equal and reactive to light, neck supple Cardiovascular: Normal rate, regular rhythm and normal heart sounds.  No murmur heard. No BLE edema. Pulmonary/Chest: Effort normal and breath sounds normal. No respiratory distress. Abdominal: Soft.  There is no tenderness. Psychiatric: Patient has a normal mood and affect. behavior is normal. Judgment and thought content normal.     Assessment & Plan:   Problem List Items Addressed This Visit   None     Follow up plan: No follow-ups on file.

## 2022-11-29 ENCOUNTER — Ambulatory Visit: Payer: Commercial Managed Care - PPO | Admitting: Nurse Practitioner

## 2022-11-29 ENCOUNTER — Other Ambulatory Visit: Payer: Self-pay

## 2022-11-29 ENCOUNTER — Encounter: Payer: Self-pay | Admitting: Nurse Practitioner

## 2022-11-29 VITALS — BP 118/72 | HR 68 | Temp 98.0°F | Resp 16 | Ht 72.0 in | Wt 292.4 lb

## 2022-11-29 DIAGNOSIS — F411 Generalized anxiety disorder: Secondary | ICD-10-CM

## 2022-11-29 DIAGNOSIS — I10 Essential (primary) hypertension: Secondary | ICD-10-CM

## 2022-11-29 DIAGNOSIS — F331 Major depressive disorder, recurrent, moderate: Secondary | ICD-10-CM | POA: Diagnosis not present

## 2022-11-29 DIAGNOSIS — Z6841 Body Mass Index (BMI) 40.0 and over, adult: Secondary | ICD-10-CM | POA: Diagnosis not present

## 2022-11-29 DIAGNOSIS — E1165 Type 2 diabetes mellitus with hyperglycemia: Secondary | ICD-10-CM | POA: Diagnosis not present

## 2022-11-29 DIAGNOSIS — E782 Mixed hyperlipidemia: Secondary | ICD-10-CM

## 2022-11-29 MED ORDER — ESCITALOPRAM OXALATE 10 MG PO TABS
10.0000 mg | ORAL_TABLET | Freq: Every day | ORAL | 1 refills | Status: DC
  Filled 2022-11-29: qty 90, 90d supply, fill #0
  Filled 2023-02-21: qty 90, 90d supply, fill #1

## 2022-11-29 NOTE — Assessment & Plan Note (Signed)
Continue taking telmisartan-hydrochlorothiazide 40-12.5 mg daily

## 2022-11-29 NOTE — Assessment & Plan Note (Signed)
Continue lexapro 10mg  daily.

## 2022-11-29 NOTE — Assessment & Plan Note (Addendum)
Continue taking ozempic and metformin 500 mg BID

## 2022-11-29 NOTE — Assessment & Plan Note (Signed)
Continue ozempic,  continue to work on lifestyle modification

## 2022-11-29 NOTE — Assessment & Plan Note (Signed)
Continue to work on life style modification 

## 2022-12-09 ENCOUNTER — Other Ambulatory Visit: Payer: Self-pay | Admitting: Nurse Practitioner

## 2022-12-09 ENCOUNTER — Other Ambulatory Visit: Payer: Self-pay

## 2022-12-09 DIAGNOSIS — E119 Type 2 diabetes mellitus without complications: Secondary | ICD-10-CM

## 2022-12-11 ENCOUNTER — Other Ambulatory Visit: Payer: Self-pay

## 2022-12-12 ENCOUNTER — Other Ambulatory Visit: Payer: Self-pay

## 2022-12-12 MED FILL — Semaglutide Soln Pen-inj 1 MG/DOSE (4 MG/3ML): SUBCUTANEOUS | 28 days supply | Qty: 3 | Fill #0 | Status: AC

## 2022-12-12 NOTE — Telephone Encounter (Signed)
Not transmitted on 12/09/2022 so I have sent it. Labs are in date.  Requested Prescriptions  Pending Prescriptions Disp Refills   OZEMPIC, 1 MG/DOSE, 4 MG/3ML SOPN [Pharmacy Med Name: Semaglutide, 1 MG/DOSE, (OZEMPIC, 1 MG/DOSE,) 4 MG/3ML Solution Pen-injector] 3 mL 0    Sig: Inject 1 mg into the skin once a week.     Endocrinology:  Diabetes - GLP-1 Receptor Agonists - semaglutide Failed - 12/09/2022  3:09 PM      Failed - HBA1C in normal range and within 180 days    Hemoglobin A1C  Date Value Ref Range Status  10/31/2022 6.4 (A) 4.0 - 5.6 % Final         Passed - Cr in normal range and within 360 days    Creat  Date Value Ref Range Status  07/22/2022 1.02 0.60 - 1.26 mg/dL Final   Creatinine, Urine  Date Value Ref Range Status  10/31/2022 298 20 - 320 mg/dL Final         Passed - Valid encounter within last 6 months    Recent Outpatient Visits           1 week ago Mixed hyperlipidemia   [facility] Berniece Salines, FNP   1 month ago Mixed hyperlipidemia   [facility] Berniece Salines, FNP   4 months ago Mixed hyperlipidemia   [facility] Berniece Salines, FNP       Future Appointments             In 4 months Zane Herald, Rudolpho Sevin, FNP [facility], [facility]

## 2023-01-06 ENCOUNTER — Other Ambulatory Visit: Payer: Self-pay | Admitting: Nurse Practitioner

## 2023-01-06 ENCOUNTER — Other Ambulatory Visit: Payer: Self-pay

## 2023-01-06 DIAGNOSIS — E119 Type 2 diabetes mellitus without complications: Secondary | ICD-10-CM

## 2023-01-06 MED ORDER — OZEMPIC (1 MG/DOSE) 4 MG/3ML ~~LOC~~ SOPN
1.0000 mg | PEN_INJECTOR | SUBCUTANEOUS | 0 refills | Status: DC
  Filled 2023-01-06: qty 3, 28d supply, fill #0

## 2023-01-06 NOTE — Telephone Encounter (Signed)
Requested Prescriptions  Pending Prescriptions Disp Refills   Semaglutide, 1 MG/DOSE, (OZEMPIC, 1 MG/DOSE,) 4 MG/3ML SOPN 3 mL 0    Sig: Inject 1 mg into the skin once a week.     Endocrinology:  Diabetes - GLP-1 Receptor Agonists - semaglutide Failed - 01/06/2023  5:47 AM      Failed - HBA1C in normal range and within 180 days    Hemoglobin A1C  Date Value Ref Range Status  10/31/2022 6.4 (A) 4.0 - 5.6 % Final         Passed - Cr in normal range and within 360 days    Creat  Date Value Ref Range Status  07/22/2022 1.02 0.60 - 1.26 mg/dL Final   Creatinine, Urine  Date Value Ref Range Status  10/31/2022 298 20 - 320 mg/dL Final         Passed - Valid encounter within last 6 months    Recent Outpatient Visits           1 month ago Mixed hyperlipidemia   [facility] Berniece Salines, FNP   2 months ago Mixed hyperlipidemia   [facility] Berniece Salines, FNP   5 months ago Mixed hyperlipidemia   [facility] Berniece Salines, FNP       Future Appointments             In 3 months Zane Herald, Rudolpho Sevin, FNP [facility], [facility]

## 2023-01-13 ENCOUNTER — Other Ambulatory Visit: Payer: Self-pay | Admitting: Nurse Practitioner

## 2023-01-13 ENCOUNTER — Other Ambulatory Visit: Payer: Self-pay

## 2023-01-13 DIAGNOSIS — I10 Essential (primary) hypertension: Secondary | ICD-10-CM

## 2023-01-13 DIAGNOSIS — E119 Type 2 diabetes mellitus without complications: Secondary | ICD-10-CM

## 2023-01-13 MED FILL — Telmisartan-Hydrochlorothiazide Tab 40-12.5 MG: ORAL | 90 days supply | Qty: 90 | Fill #0 | Status: AC

## 2023-01-13 MED FILL — Metformin HCl Tab ER 24HR 500 MG: ORAL | 90 days supply | Qty: 180 | Fill #0 | Status: AC

## 2023-01-13 NOTE — Telephone Encounter (Signed)
Requested Prescriptions  Pending Prescriptions Disp Refills   metFORMIN (GLUCOPHAGE-XR) 500 MG 24 hr tablet 180 tablet 0    Sig: Take 1 tablet (500 mg total) by mouth 2 (two) times daily with a meal.     Endocrinology:  Diabetes - Biguanides Passed - 01/13/2023  8:09 AM      Passed - Cr in normal range and within 360 days    Creat  Date Value Ref Range Status  07/22/2022 1.02 0.60 - 1.26 mg/dL Final   Creatinine, Urine  Date Value Ref Range Status  10/31/2022 298 20 - 320 mg/dL Final         Passed - HBA1C is between 0 and 7.9 and within 180 days    Hemoglobin A1C  Date Value Ref Range Status  10/31/2022 6.4 (A) 4.0 - 5.6 % Final         Passed - eGFR in normal range and within 360 days    eGFR  Date Value Ref Range Status  07/22/2022 98 > OR = 60 mL/min/1.1m2 Final         Passed - B12 Level in normal range and within 720 days    Vitamin B-12  Date Value Ref Range Status  07/22/2022 552 200 - 1,100 pg/mL Final         Passed - Valid encounter within last 6 months    Recent Outpatient Visits           1 month ago Mixed hyperlipidemia   [facility] Berniece Salines, FNP   2 months ago Mixed hyperlipidemia   [facility] Berniece Salines, FNP   5 months ago Mixed hyperlipidemia   [facility] Berniece Salines, FNP       Future Appointments             In 3 months Zane Herald, Rudolpho Sevin, FNP [facility], [facility]            Passed - CBC within normal limits and completed in the last 12 months    WBC  Date Value Ref Range Status  07/22/2022 6.4 3.8 - 10.8 Thousand/uL Final   RBC  Date Value Ref Range Status  07/22/2022 5.64 4.20 - 5.80 Million/uL Final   Hemoglobin  Date Value Ref Range Status  07/22/2022 17.8 (H) 13.2 - 17.1 g/dL Final   HCT  Date Value Ref Range Status  07/22/2022 50.6 (H) 38.5 - 50.0 % Final   MCHC  Date Value Ref Range Status   07/22/2022 35.2 32.0 - 36.0 g/dL Final   [facility]  Date Value Ref Range Status  07/22/2022 31.6 27.0 - 33.0 pg Final   MCV  Date Value Ref Range Status  07/22/2022 89.7 80.0 - 100.0 fL Final   No results found for: "PLTCOUNTKUC", "LABPLAT", "POCPLA" RDW  Date Value Ref Range Status  07/22/2022 12.2 11.0 - 15.0 % Final          telmisartan-hydrochlorothiazide (MICARDIS HCT) 40-12.5 MG tablet 90 tablet 0    Sig: Take 1 tablet by mouth daily.     Cardiovascular: ARB + Diuretic Combos Passed - 01/13/2023  8:09 AM      Passed - K in normal range and within 180 days    Potassium  Date Value Ref Range Status  07/22/2022 3.9 3.5 - 5.3 mmol/L Final         Passed - Na in normal range and within 180  days    Sodium  Date Value Ref Range Status  07/22/2022 139 135 - 146 mmol/L Final         Passed - Cr in normal range and within 180 days    Creat  Date Value Ref Range Status  07/22/2022 1.02 0.60 - 1.26 mg/dL Final   Creatinine, Urine  Date Value Ref Range Status  10/31/2022 298 20 - 320 mg/dL Final         Passed - eGFR is 10 or above and within 180 days    eGFR  Date Value Ref Range Status  07/22/2022 98 > OR = 60 mL/min/1.84m2 Final         Passed - Patient is not pregnant      Passed - Last BP in normal range    BP Readings from Last 1 Encounters:  11/29/22 118/72         Passed - Valid encounter within last 6 months    Recent Outpatient Visits           1 month ago Mixed hyperlipidemia   [facility] Berniece Salines, FNP   2 months ago Mixed hyperlipidemia   [facility] Berniece Salines, FNP   5 months ago Mixed hyperlipidemia   [facility] Berniece Salines, FNP       Future Appointments             In 3 months Zane Herald, Rudolpho Sevin, FNP [facility], [facility]

## 2023-01-15 ENCOUNTER — Other Ambulatory Visit: Payer: Self-pay

## 2023-02-03 ENCOUNTER — Other Ambulatory Visit (HOSPITAL_COMMUNITY): Payer: Self-pay

## 2023-02-03 ENCOUNTER — Other Ambulatory Visit: Payer: Self-pay | Admitting: Nurse Practitioner

## 2023-02-03 ENCOUNTER — Other Ambulatory Visit: Payer: Self-pay

## 2023-02-03 DIAGNOSIS — E119 Type 2 diabetes mellitus without complications: Secondary | ICD-10-CM

## 2023-02-03 MED ORDER — OZEMPIC (1 MG/DOSE) 4 MG/3ML ~~LOC~~ SOPN
1.0000 mg | PEN_INJECTOR | SUBCUTANEOUS | 0 refills | Status: DC
  Filled 2023-02-03: qty 9, 84d supply, fill #0

## 2023-02-03 NOTE — Telephone Encounter (Signed)
Requested Prescriptions  Pending Prescriptions Disp Refills   Semaglutide, 1 MG/DOSE, (OZEMPIC, 1 MG/DOSE,) 4 MG/3ML SOPN 9 mL 0    Sig: Inject 1 mg into the skin once a week.     Endocrinology:  Diabetes - GLP-1 Receptor Agonists - semaglutide Failed - 02/03/2023 10:26 AM      Failed - HBA1C in normal range and within 180 days    Hemoglobin A1C  Date Value Ref Range Status  10/31/2022 6.4 (A) 4.0 - 5.6 % Final         Passed - Cr in normal range and within 360 days    Creat  Date Value Ref Range Status  07/22/2022 1.02 0.60 - 1.26 mg/dL Final   Creatinine, Urine  Date Value Ref Range Status  10/31/2022 298 20 - 320 mg/dL Final         Passed - Valid encounter within last 6 months    Recent Outpatient Visits           2 months ago Mixed hyperlipidemia   [facility] Berniece Salines, FNP   3 months ago Mixed hyperlipidemia   [facility] Berniece Salines, FNP   6 months ago Mixed hyperlipidemia   [facility] Berniece Salines, FNP       Future Appointments             In 2 months Zane Herald, Rudolpho Sevin, FNP [facility], [facility]

## 2023-04-14 ENCOUNTER — Other Ambulatory Visit: Payer: Self-pay | Admitting: Nurse Practitioner

## 2023-04-14 DIAGNOSIS — I10 Essential (primary) hypertension: Secondary | ICD-10-CM

## 2023-04-14 DIAGNOSIS — E119 Type 2 diabetes mellitus without complications: Secondary | ICD-10-CM

## 2023-04-17 ENCOUNTER — Other Ambulatory Visit: Payer: Self-pay

## 2023-04-17 MED ORDER — METFORMIN HCL ER 500 MG PO TB24
500.0000 mg | ORAL_TABLET | Freq: Two times a day (BID) | ORAL | 0 refills | Status: DC
  Filled 2023-04-17: qty 180, 90d supply, fill #0

## 2023-04-17 MED ORDER — TELMISARTAN-HCTZ 40-12.5 MG PO TABS
1.0000 | ORAL_TABLET | Freq: Every day | ORAL | 0 refills | Status: DC
  Filled 2023-04-17: qty 90, 90d supply, fill #0

## 2023-04-17 NOTE — Telephone Encounter (Signed)
Requested Prescriptions  Pending Prescriptions Disp Refills   metFORMIN (GLUCOPHAGE-XR) 500 MG 24 hr tablet 180 tablet 0    Sig: Take 1 tablet (500 mg total) by mouth 2 (two) times daily with a meal.     Endocrinology:  Diabetes - Biguanides Passed - 04/14/2023  6:05 PM      Passed - Cr in normal range and within 360 days    Creat  Date Value Ref Range Status  07/22/2022 1.02 0.60 - 1.26 mg/dL Final   Creatinine, Urine  Date Value Ref Range Status  10/31/2022 298 20 - 320 mg/dL Final         Passed - HBA1C is between 0 and 7.9 and within 180 days    Hemoglobin A1C  Date Value Ref Range Status  10/31/2022 6.4 (A) 4.0 - 5.6 % Final         Passed - eGFR in normal range and within 360 days    eGFR  Date Value Ref Range Status  07/22/2022 98 > OR = 60 mL/min/1.56m2 Final         Passed - B12 Level in normal range and within 720 days    Vitamin B-12  Date Value Ref Range Status  07/22/2022 552 200 - 1,100 pg/mL Final         Passed - Valid encounter within last 6 months    Recent Outpatient Visits           4 months ago Mixed hyperlipidemia   [facility] Berniece Salines, FNP   5 months ago Mixed hyperlipidemia   [facility] Berniece Salines, FNP   8 months ago Mixed hyperlipidemia   [facility] Berniece Salines, FNP       Future Appointments             In 1 week Zane Herald, Rudolpho Sevin, FNP [facility], [facility]            Passed - CBC within normal limits and completed in the last 12 months    WBC  Date Value Ref Range Status  07/22/2022 6.4 3.8 - 10.8 Thousand/uL Final   RBC  Date Value Ref Range Status  07/22/2022 5.64 4.20 - 5.80 Million/uL Final   Hemoglobin  Date Value Ref Range Status  07/22/2022 17.8 (H) 13.2 - 17.1 g/dL Final   HCT  Date Value Ref Range Status  07/22/2022 50.6 (H) 38.5 - 50.0 % Final   MCHC  Date Value Ref Range Status   07/22/2022 35.2 32.0 - 36.0 g/dL Final   [facility]  Date Value Ref Range Status  07/22/2022 31.6 27.0 - 33.0 pg Final   MCV  Date Value Ref Range Status  07/22/2022 89.7 80.0 - 100.0 fL Final   No results found for: "PLTCOUNTKUC", "LABPLAT", "POCPLA" RDW  Date Value Ref Range Status  07/22/2022 12.2 11.0 - 15.0 % Final          telmisartan-hydrochlorothiazide (MICARDIS HCT) 40-12.5 MG tablet 90 tablet 0    Sig: Take 1 tablet by mouth daily.     Cardiovascular: ARB + Diuretic Combos Failed - 04/14/2023  6:05 PM      Failed - K in normal range and within 180 days    Potassium  Date Value Ref Range Status  07/22/2022 3.9 3.5 - 5.3 mmol/L Final         Failed - Na in normal range and within 180  days    Sodium  Date Value Ref Range Status  07/22/2022 139 135 - 146 mmol/L Final         Failed - Cr in normal range and within 180 days    Creat  Date Value Ref Range Status  07/22/2022 1.02 0.60 - 1.26 mg/dL Final   Creatinine, Urine  Date Value Ref Range Status  10/31/2022 298 20 - 320 mg/dL Final         Failed - eGFR is 10 or above and within 180 days    eGFR  Date Value Ref Range Status  07/22/2022 98 > OR = 60 mL/min/1.94m2 Final         Passed - Patient is not pregnant      Passed - Last BP in normal range    BP Readings from Last 1 Encounters:  11/29/22 118/72         Passed - Valid encounter within last 6 months    Recent Outpatient Visits           4 months ago Mixed hyperlipidemia   [facility] Berniece Salines, FNP   5 months ago Mixed hyperlipidemia   [facility] Berniece Salines, FNP   8 months ago Mixed hyperlipidemia   [facility] Berniece Salines, FNP       Future Appointments             In 1 week Zane Herald, Rudolpho Sevin, FNP [facility], [facility]

## 2023-04-27 NOTE — Progress Notes (Signed)
BP 130/82   Pulse 64   Temp 98.5 F (36.9 C) (Oral)   Resp 16   Ht 6' (1.829 m)   Wt (!) 304 lb 8 oz (138.1 kg)   SpO2 96%   BMI 41.30 kg/m    Subjective:    Patient ID: Brett Burke, male    DOB: [birth year], 37 y.o.   MRN: [id number]  HPI: Brett Burke is a 37 y.o. male  Chief Complaint  Patient presents with   Medical Management of Chronic Issues    Discussed the use of AI scribe software for clinical note transcription with the patient, who gave verbal consent to proceed.  History of Present Illness   The patient, with a history of hypertension, type two diabetes, hyperlipidemia, obesity, depression and anxiety, presents for medication management and treatment of persistent athlete's foot. He reports his blood pressure has been stable and his blood sugars have been between the nineties to upper one twenties. He denies any mood issues and reports feeling 'a whole lot better.' He is currently taking Lexapro 10mg  daily, metformin 500mg  twice daily, Ozempic 1mg  weekly, and telmisartan-hydrochlorothiazide 40-12.5mg  daily. He is interested in discontinuing metformin and increasing the dose of Ozempic.  In addition to the medication management, he has been struggling with persistent athlete's foot despite using over the counter creams and a prescription cream. He is trying to schedule an appointment with dermatology, but has not yet done so.       04/28/2023    7:39 AM 11/29/2022    7:34 AM 10/31/2022    7:59 AM  Depression screen PHQ 2/9  Decreased Interest 0 0 2  Down, Depressed, Hopeless 0 0 2  PHQ - 2 Score 0 0 4  Altered sleeping 0 0 3  Tired, decreased energy 0 0 3  Change in appetite 0 0 1  Feeling bad or failure about yourself  0 0 3  Trouble concentrating 0 0 3  Moving slowly or fidgety/restless 0 0 1  Suicidal thoughts 0 0 1  PHQ-9 Score 0 0 19  Difficult doing work/chores Not difficult at all Not difficult at all Very difficult       04/28/2023    7:40  AM 11/29/2022    7:38 AM 10/31/2022    8:00 AM  GAD 7 : Generalized Anxiety Score  Nervous, Anxious, on Edge 0 0 1  Control/stop worrying 0 0 2  Worry too much - different things 0 0 2  Trouble relaxing 0 0 2  Restless 0 0 1  Easily annoyed or irritable 0 0 3  Afraid - awful might happen 0 0 1  Total GAD 7 Score 0 0 12  Anxiety Difficulty Not difficult at all Not difficult at all Very difficult     Relevant past medical, surgical, family and social history reviewed and updated as indicated. Interim medical history since our last visit reviewed. Allergies and medications reviewed and updated.  Review of Systems  Constitutional: Negative for fever or weight change.  Respiratory: Negative for cough and shortness of breath.   Cardiovascular: Negative for chest pain or palpitations.  Gastrointestinal: Negative for abdominal pain, no bowel changes.  Musculoskeletal: Negative for gait problem or joint swelling.  Skin: Negative for rash.  Neurological: Negative for dizziness or headache.  No other specific complaints in a complete review of systems (except as listed in HPI above).      Objective:    BP 130/82   Pulse 64  Temp 98.5 F (36.9 C) (Oral)   Resp 16   Ht 6' (1.829 m)   Wt (!) 304 lb 8 oz (138.1 kg)   SpO2 96%   BMI 41.30 kg/m   BP Readings from Last 3 Encounters:  04/28/23 130/82  11/29/22 118/72  10/31/22 124/76     Wt Readings from Last 3 Encounters:  04/28/23 (!) 304 lb 8 oz (138.1 kg)  11/29/22 292 lb 6.4 oz (132.6 kg)  10/31/22 283 lb 14.4 oz (128.8 kg)    Physical Exam  Constitutional: Patient appears well-developed and well-nourished. Obese  No distress.  HEENT: head atraumatic, normocephalic, pupils equal and reactive to light, neck supple Cardiovascular: Normal rate, regular rhythm and normal heart sounds.  No murmur heard. No BLE edema. Pulmonary/Chest: Effort normal and breath sounds normal. No respiratory distress. Abdominal: Soft.  There is no  tenderness. Psychiatric: Patient has a normal mood and affect. behavior is normal. Judgment and thought content normal.  Results for orders placed or performed in visit on 10/31/22  POCT HgB A1C   Collection Time: 10/31/22  9:04 AM  Result Value Ref Range   Hemoglobin A1C 6.4 (A) 4.0 - 5.6 %   HbA1c POC (<> result, manual entry)     HbA1c, POC (prediabetic range)     HbA1c, POC (controlled diabetic range)    Microalbumin / creatinine urine ratio   Collection Time: 10/31/22  9:54 AM  Result Value Ref Range   Creatinine, Urine 298 20 - 320 mg/dL   Microalb, Ur 1.0 mg/dL   Microalb Creat Ratio 3 <30 mg/g creat   Last metabolic panel Lab Results  Component Value Date   GLUCOSE 325 (H) 07/22/2022   NA 139 07/22/2022   K 3.9 07/22/2022   CL 101 07/22/2022   CO2 24 07/22/2022   BUN 17 07/22/2022   CREATININE 1.02 07/22/2022   EGFR 98 07/22/2022   CALCIUM 9.5 07/22/2022   PROT 7.7 07/22/2022   BILITOT 0.8 07/22/2022   AST 54 (H) 07/22/2022   ALT 119 (H) 07/22/2022   Last lipids Lab Results  Component Value Date   CHOL 263 (H) 07/22/2022   HDL 34 (L) 07/22/2022   LDLCALC 184 (H) 07/22/2022   TRIG 254 (H) 07/22/2022   CHOLHDL 7.7 (H) 07/22/2022   Last hemoglobin A1c Lab Results  Component Value Date   HGBA1C 6.4 (A) 10/31/2022        Assessment & Plan:   Problem List Items Addressed This Visit       Cardiovascular and Mediastinum   Essential hypertension   Relevant Orders   CBC with Differential/Platelet   COMPLETE METABOLIC PANEL WITH GFR     Endocrine   Type 2 diabetes mellitus with hyperglycemia, without long-term current use of insulin (HCC)   Relevant Medications   Semaglutide, 2 MG/DOSE, 8 MG/3ML SOPN   Other Relevant Orders   COMPLETE METABOLIC PANEL WITH GFR   Hemoglobin A1c     Other   Mixed hyperlipidemia - Primary   Relevant Orders   COMPLETE METABOLIC PANEL WITH GFR   Lipid panel   Class 3 severe obesity due to excess calories with serious  comorbidity and body mass index (BMI) of 40.0 to 44.9 in adult [facility])   Relevant Medications   Semaglutide, 2 MG/DOSE, 8 MG/3ML SOPN   Moderate episode of recurrent major depressive disorder (HCC)   Relevant Medications   escitalopram (LEXAPRO) 10 MG tablet   GAD (generalized anxiety disorder)   Relevant Medications  escitalopram (LEXAPRO) 10 MG tablet   Other Visit Diagnoses       New onset type 2 diabetes mellitus (HCC)       Relevant Medications   Semaglutide, 2 MG/DOSE, 8 MG/3ML SOPN     Tinea pedis of both feet       Relevant Orders   Ambulatory referral to Podiatry        Assessment and Plan    Type 2 Diabetes Mellitus Well controlled with recent blood sugars between 90s to upper 120s. Currently on metformin 500mg  twice daily and Ozempic 1mg  weekly. Discussed the possibility of discontinuing metformin and increasing Ozempic. -Discontinue metformin. -Increase Ozempic dose 2 mg weekly -Check HbA1c today. -Return in 3 months to assess the effect of medication change.  Hypertension Well controlled with a blood pressure of 130/82. Currently on telmisartan-hydrochlorothiazide 40-12.5mg  daily. -Continue current medication.  Depression and Anxiety Screenings are negative. Currently on Lexapro 10mg  daily. -Continue current medication.  Tinea Pedis (Athlete's Foot) Recurrent despite over-the-counter treatments and prescription of ciclopirox. Liver enzymes previously elevated. -Check liver enzymes today. -If liver enzymes are normal, prescribe oral antifungal treatment. -Refer to podiatry for further management.  Obesity Current weight is 304 pounds with a BMI of 41.3. -on ozempic -continue lifestyle modification  Hyperlipidemia continue lifestyle modification  General Health Maintenance -Continue current medications. -Check HbA1c and liver enzymes today. -Return in 3 months to assess the effect of medication change on HbA1c. -Refer to podiatry for recurrent tinea  pedis.        Follow up plan: Return in about 3 months (around 07/27/2023) for follow up.

## 2023-04-28 ENCOUNTER — Other Ambulatory Visit: Payer: Self-pay

## 2023-04-28 ENCOUNTER — Ambulatory Visit: Payer: Commercial Managed Care - PPO | Admitting: Nurse Practitioner

## 2023-04-28 ENCOUNTER — Encounter: Payer: Self-pay | Admitting: Nurse Practitioner

## 2023-04-28 VITALS — BP 130/82 | HR 64 | Temp 98.5°F | Resp 16 | Ht 72.0 in | Wt 304.5 lb

## 2023-04-28 DIAGNOSIS — E1165 Type 2 diabetes mellitus with hyperglycemia: Secondary | ICD-10-CM | POA: Diagnosis not present

## 2023-04-28 DIAGNOSIS — E119 Type 2 diabetes mellitus without complications: Secondary | ICD-10-CM

## 2023-04-28 DIAGNOSIS — B353 Tinea pedis: Secondary | ICD-10-CM

## 2023-04-28 DIAGNOSIS — I10 Essential (primary) hypertension: Secondary | ICD-10-CM | POA: Diagnosis not present

## 2023-04-28 DIAGNOSIS — F331 Major depressive disorder, recurrent, moderate: Secondary | ICD-10-CM | POA: Diagnosis not present

## 2023-04-28 DIAGNOSIS — E66813 Obesity, class 3: Secondary | ICD-10-CM

## 2023-04-28 DIAGNOSIS — Z7984 Long term (current) use of oral hypoglycemic drugs: Secondary | ICD-10-CM | POA: Diagnosis not present

## 2023-04-28 DIAGNOSIS — Z6841 Body Mass Index (BMI) 40.0 and over, adult: Secondary | ICD-10-CM

## 2023-04-28 DIAGNOSIS — E782 Mixed hyperlipidemia: Secondary | ICD-10-CM

## 2023-04-28 DIAGNOSIS — F411 Generalized anxiety disorder: Secondary | ICD-10-CM

## 2023-04-28 MED ORDER — ESCITALOPRAM OXALATE 10 MG PO TABS
10.0000 mg | ORAL_TABLET | Freq: Every day | ORAL | 1 refills | Status: DC
  Filled 2023-04-28 – 2023-05-22 (×2): qty 90, 90d supply, fill #0
  Filled 2023-08-18: qty 90, 90d supply, fill #1

## 2023-04-28 MED ORDER — SEMAGLUTIDE (2 MG/DOSE) 8 MG/3ML ~~LOC~~ SOPN
2.0000 mg | PEN_INJECTOR | SUBCUTANEOUS | 3 refills | Status: DC
  Filled 2023-04-28: qty 3, 28d supply, fill #0
  Filled 2023-05-29: qty 3, 28d supply, fill #1
  Filled 2023-06-22: qty 3, 28d supply, fill #2
  Filled 2023-07-21: qty 3, 28d supply, fill #3

## 2023-04-29 LAB — COMPLETE METABOLIC PANEL WITH GFR
AG Ratio: 1.8 (calc) (ref 1.0–2.5)
ALT: 62 U/L — ABNORMAL HIGH (ref 9–46)
AST: 30 U/L (ref 10–40)
Albumin: 4.2 g/dL (ref 3.6–5.1)
Alkaline phosphatase (APISO): 77 U/L (ref 36–130)
BUN: 18 mg/dL (ref 7–25)
CO2: 27 mmol/L (ref 20–32)
Calcium: 9.2 mg/dL (ref 8.6–10.3)
Chloride: 104 mmol/L (ref 98–110)
Creat: 1.01 mg/dL (ref 0.60–1.26)
Globulin: 2.4 g/dL (ref 1.9–3.7)
Glucose, Bld: 126 mg/dL — ABNORMAL HIGH (ref 65–99)
Potassium: 4.2 mmol/L (ref 3.5–5.3)
Sodium: 140 mmol/L (ref 135–146)
Total Bilirubin: 0.6 mg/dL (ref 0.2–1.2)
Total Protein: 6.6 g/dL (ref 6.1–8.1)
eGFR: 98 mL/min/{1.73_m2} (ref >=60)

## 2023-04-29 LAB — CBC WITH DIFFERENTIAL/PLATELET
Absolute Lymphocytes: 2100 {cells}/uL (ref 850–3900)
Absolute Monocytes: 392 {cells}/uL (ref 200–950)
Basophils Absolute: 28 {cells}/uL (ref 0–200)
Basophils Relative: 0.4 %
Eosinophils Absolute: 161 {cells}/uL (ref 15–500)
Eosinophils Relative: 2.3 %
HCT: 46.2 % (ref 38.5–50.0)
Hemoglobin: 15.7 g/dL (ref 13.2–17.1)
MCH: 31.6 pg (ref 27.0–33.0)
MCHC: 34 g/dL (ref 32.0–36.0)
MCV: 93 fL (ref 80.0–100.0)
MPV: 10.6 fL (ref 7.5–12.5)
Monocytes Relative: 5.6 %
Neutro Abs: 4319 {cells}/uL (ref 1500–7800)
Neutrophils Relative %: 61.7 %
Platelets: 229 10*3/uL (ref 140–400)
RBC: 4.97 10*6/uL (ref 4.20–5.80)
RDW: 12.3 % (ref 11.0–15.0)
Total Lymphocyte: 30 %
WBC: 7 10*3/uL (ref 3.8–10.8)

## 2023-04-29 LAB — LIPID PANEL
Cholesterol: 183 mg/dL (ref <200)
HDL: 35 mg/dL — ABNORMAL LOW (ref >=40)
LDL Cholesterol (Calc): 126 mg/dL — ABNORMAL HIGH
Non-HDL Cholesterol (Calc): 148 mg/dL — ABNORMAL HIGH (ref <130)
Total CHOL/HDL Ratio: 5.2 (calc) — ABNORMAL HIGH (ref <5.0)
Triglycerides: 110 mg/dL (ref <150)

## 2023-04-29 LAB — HEMOGLOBIN A1C
Hgb A1c MFr Bld: 6.1 %{Hb} — ABNORMAL HIGH (ref <5.7)
Mean Plasma Glucose: 128 mg/dL
eAG (mmol/L): 7.1 mmol/L

## 2023-05-23 ENCOUNTER — Ambulatory Visit: Payer: Commercial Managed Care - PPO | Admitting: Podiatry

## 2023-05-23 ENCOUNTER — Other Ambulatory Visit: Payer: Self-pay

## 2023-05-23 ENCOUNTER — Encounter: Payer: Self-pay | Admitting: Podiatry

## 2023-05-23 VITALS — Ht 72.0 in | Wt 304.5 lb

## 2023-05-23 DIAGNOSIS — B353 Tinea pedis: Secondary | ICD-10-CM

## 2023-05-23 MED ORDER — CLOTRIMAZOLE-BETAMETHASONE 1-0.05 % EX CREA
1.0000 | TOPICAL_CREAM | Freq: Every day | CUTANEOUS | 0 refills | Status: AC
  Filled 2023-05-23: qty 45, 45d supply, fill #0

## 2023-05-23 NOTE — Progress Notes (Signed)
  Subjective:  Patient ID: Brett Burke, male    DOB: 10-Feb-1986,  MRN: [id number]  Chief Complaint  Patient presents with   Tinea Pedis    Pt is here due to athletes feet which he has had for a while, states he has itching and burning.    38 y.o. male presents with the above complaint.  Patient presents with bilateral plantar athlete's foot.  Patient states been present for quite some time.  Patient has tried over-the-counter options as well as prescription option with dermatology with clindamycin  cream.  They want to get it evaluated he denies any other acute complaints he has not tried any other topical medication.  His liver enzymes are elevated and therefore does not qualify for oral medication   Review of Systems: Negative except as noted in the HPI. Denies N/V/F/Ch.  Past Medical History:  Diagnosis Date   Hypertension     Current Outpatient Medications:    clotrimazole -betamethasone  (LOTRISONE ) cream, Apply 1 Application topically daily., Disp: 45 g, Rfl: 0   Blood Glucose Monitoring Suppl (BLOOD GLUCOSE MONITOR SYSTEM) w/Device KIT, Use as directed (Patient not taking: Reported on 04/28/2023), Disp: 1 kit, Rfl: 0   clindamycin  (CLEOCIN  T) 1 % external solution, Apply to affected area daily, Disp: 60 mL, Rfl: 6   escitalopram  (LEXAPRO ) 10 MG tablet, Take 1 tablet (10 mg total) by mouth daily., Disp: 90 tablet, Rfl: 1   Glucose Blood (BLOOD GLUCOSE TEST STRIPS) STRP, Use 3 times weekly (Patient not taking: Reported on 04/28/2023), Disp: 100 strip, Rfl: 0   Semaglutide , 2 MG/DOSE, 8 MG/3ML SOPN, Inject 2 mg as directed once a week., Disp: 3 mL, Rfl: 3   telmisartan -hydrochlorothiazide  (MICARDIS  HCT) 40-12.5 MG tablet, Take 1 tablet by mouth daily., Disp: 90 tablet, Rfl: 0  Social History   Tobacco Use  Smoking Status Never   Passive exposure: Current  Smokeless Tobacco Never    No Known Allergies Objective:  There were no vitals filed for this visit. Body mass index is  41.3 kg/m. Constitutional Well developed. Well nourished.  Vascular Dorsalis pedis pulses palpable bilaterally. Posterior tibial pulses palpable bilaterally. Capillary refill normal to all digits.  No cyanosis or clubbing noted. Pedal hair growth normal.  Neurologic Normal speech. Oriented to person, place, and time. Epicritic sensation to light touch grossly present bilaterally.  Dermatologic Nails within normal limits Skin skin epidermolysis with subjective complaint of itching noted to bilateral feet.  No open wounds or lesion noted  Orthopedic: Normal joint ROM without pain or crepitus bilaterally. No visible deformities. No bony tenderness.   Radiographs: None Assessment:   1. Tinea pedis of both feet    Plan:  Patient was evaluated and treated and all questions answered.  Bilateral athlete's foot -All questions and concerns were discussed with the patient in extensive detail given the amount o of athlete's foot that is present patient will benefit from topical cream with antifungal.  Lotrisone  cream was sent to the pharmacy of asked him apply twice a day.  He ideally needs 4 weeks of Lamisil however due to his elevated enzymes we will hold off on that for now.  I also discussed with him to reach out to his PCP to figure out the reason for elevated enzyme.  He states understanding will do so.  No follow-ups on file.

## 2023-07-10 ENCOUNTER — Other Ambulatory Visit: Payer: Self-pay | Admitting: Nurse Practitioner

## 2023-07-10 DIAGNOSIS — I10 Essential (primary) hypertension: Secondary | ICD-10-CM

## 2023-07-11 ENCOUNTER — Other Ambulatory Visit: Payer: Self-pay

## 2023-07-11 ENCOUNTER — Other Ambulatory Visit: Payer: Self-pay | Admitting: Nurse Practitioner

## 2023-07-11 DIAGNOSIS — I10 Essential (primary) hypertension: Secondary | ICD-10-CM

## 2023-07-12 ENCOUNTER — Other Ambulatory Visit: Payer: Self-pay

## 2023-07-12 MED FILL — Telmisartan-Hydrochlorothiazide Tab 40-12.5 MG: ORAL | 90 days supply | Qty: 90 | Fill #0 | Status: AC

## 2023-07-12 NOTE — Telephone Encounter (Signed)
 Requested Prescriptions  Pending Prescriptions Disp Refills   telmisartan-hydrochlorothiazide (MICARDIS HCT) 40-12.5 MG tablet 90 tablet 0    Sig: Take 1 tablet by mouth daily.     Cardiovascular: ARB + Diuretic Combos Passed - 07/12/2023 12:23 PM      Passed - K in normal range and within 180 days    Potassium  Date Value Ref Range Status  04/28/2023 4.2 3.5 - 5.3 mmol/L Final         Passed - Na in normal range and within 180 days    Sodium  Date Value Ref Range Status  04/28/2023 140 135 - 146 mmol/L Final         Passed - Cr in normal range and within 180 days    Creat  Date Value Ref Range Status  04/28/2023 1.01 0.60 - 1.26 mg/dL Final   Creatinine, Urine  Date Value Ref Range Status  10/31/2022 298 20 - 320 mg/dL Final         Passed - eGFR is 10 or above and within 180 days    eGFR  Date Value Ref Range Status  04/28/2023 98 > OR = 60 mL/min/1.25m2 Final         Passed - Patient is not pregnant      Passed - Last BP in normal range    BP Readings from Last 1 Encounters:  04/28/23 130/82         Passed - Valid encounter within last 6 months    Recent Outpatient Visits           2 months ago Mixed hyperlipidemia   [facility] Berniece Salines, FNP   7 months ago Mixed hyperlipidemia   [facility] Berniece Salines, FNP   8 months ago Mixed hyperlipidemia   [facility] Berniece Salines, FNP   11 months ago Mixed hyperlipidemia   [facility] Berniece Salines, FNP       Future Appointments             In 2 weeks Zane Herald, Rudolpho Sevin, FNP [facility], [facility]

## 2023-07-13 ENCOUNTER — Other Ambulatory Visit: Payer: Self-pay

## 2023-07-27 ENCOUNTER — Ambulatory Visit: Payer: Commercial Managed Care - PPO | Admitting: Nurse Practitioner

## 2023-07-27 ENCOUNTER — Other Ambulatory Visit: Payer: Self-pay

## 2023-07-27 VITALS — BP 126/84 | HR 75 | Temp 97.9°F | Resp 16 | Ht 72.0 in | Wt 300.2 lb

## 2023-07-27 DIAGNOSIS — E782 Mixed hyperlipidemia: Secondary | ICD-10-CM | POA: Diagnosis not present

## 2023-07-27 DIAGNOSIS — I10 Essential (primary) hypertension: Secondary | ICD-10-CM | POA: Diagnosis not present

## 2023-07-27 DIAGNOSIS — E66813 Obesity, class 3: Secondary | ICD-10-CM

## 2023-07-27 DIAGNOSIS — Z6841 Body Mass Index (BMI) 40.0 and over, adult: Secondary | ICD-10-CM | POA: Diagnosis not present

## 2023-07-27 DIAGNOSIS — F411 Generalized anxiety disorder: Secondary | ICD-10-CM | POA: Diagnosis not present

## 2023-07-27 DIAGNOSIS — F331 Major depressive disorder, recurrent, moderate: Secondary | ICD-10-CM | POA: Diagnosis not present

## 2023-07-27 DIAGNOSIS — E1165 Type 2 diabetes mellitus with hyperglycemia: Secondary | ICD-10-CM

## 2023-07-27 LAB — POCT GLYCOSYLATED HEMOGLOBIN (HGB A1C): Hemoglobin A1C: 6.4 % — AB (ref 4.0–5.6)

## 2023-07-27 NOTE — Progress Notes (Signed)
 BP 126/84 (Cuff Size: Large)   Pulse 75   Temp 97.9 F (36.6 C) (Oral)   Resp 16   Ht 6' (1.829 m)   Wt (!) 300 lb 3.2 oz (136.2 kg)   SpO2 98%   BMI 40.71 kg/m    Subjective:    Patient ID: Brett Burke, male    DOB: 1985-12-30, 38 y.o.   MRN: [id number]  HPI: Brett Burke is a 38 y.o. male  Chief Complaint  Patient presents with   Medical Management of Chronic Issues    3 months     Discussed the use of AI scribe software for clinical note transcription with the patient, who gave verbal consent to proceed.  History of Present Illness Brett Burke is a 38 year old male with type 2 diabetes who presents for a three month follow-up.  He has type 2 diabetes with a recent hemoglobin A1c of 6.4, which has increased from a previous value of 6.1. He recalls a prior A1c of 11.8, indicating significant improvement over time. He is currently taking Ozempic 2 grams weekly and plans to pick up a refill after the visit.  His blood pressure is well-controlled at 126/84 mmHg, and he reports no issues with blood pressure since the last visit. He is taking telmisartan-hydrochlorothiazide 40-12.5 mg daily.  For his depression and anxiety, he is taking Lexapro 10 mg daily and reports that his mood is 'doing good'.  Hyperlipidemia continues to work on diet.   He is on Ozempic, which is also used for weight management in addition to diabetes control. Brett Burke with comorbidites of DM, and HTN, HLD.   Weight today 300 lbs with BMI of 40.71  He reports feeling 'pretty good' overall and denies any new issues since the last visit. He confirms that he has to work all day today.         07/27/2023    7:40 AM 04/28/2023    7:39 AM 11/29/2022    7:34 AM  Depression screen PHQ 2/9  Decreased Interest 0 0 0  Down, Depressed, Hopeless 0 0 0  PHQ - 2 Score 0 0 0  Altered sleeping 0 0 0  Tired, decreased energy 0 0 0  Change in appetite 0 0 0  Feeling bad or failure about yourself  0 0  0  Trouble concentrating 0 0 0  Moving slowly or fidgety/restless 0 0 0  Suicidal thoughts 0 0 0  PHQ-9 Score 0 0 0  Difficult doing work/chores Not difficult at all Not difficult at all Not difficult at all    Relevant past medical, surgical, family and social history reviewed and updated as indicated. Interim medical history since our last visit reviewed. Allergies and medications reviewed and updated.  Review of Systems  Constitutional: Negative for fever or weight change.  Respiratory: Negative for cough and shortness of breath.   Cardiovascular: Negative for chest pain or palpitations.  Gastrointestinal: Negative for abdominal pain, no bowel changes.  Musculoskeletal: Negative for gait problem or joint swelling.  Skin: Negative for rash.  Neurological: Negative for dizziness or headache.  No other specific complaints in a complete review of systems (except as listed in HPI above).      Objective:    BP 126/84 (Cuff Size: Large)   Pulse 75   Temp 97.9 F (36.6 C) (Oral)   Resp 16   Ht 6' (1.829 m)   Wt (!) 300 lb 3.2 oz (136.2 kg)  SpO2 98%   BMI 40.71 kg/m    Wt Readings from Last 3 Encounters:  07/27/23 (!) 300 lb 3.2 oz (136.2 kg)  05/23/23 (!) 304 lb 8 oz (138.1 kg)  04/28/23 (!) 304 lb 8 oz (138.1 kg)    Physical Exam Vitals reviewed.  Constitutional:      Appearance: Normal appearance.  HENT:     Head: Normocephalic.  Cardiovascular:     Rate and Rhythm: Normal rate and regular rhythm.  Pulmonary:     Effort: Pulmonary effort is normal.     Breath sounds: Normal breath sounds.  Musculoskeletal:        General: Normal range of motion.  Skin:    General: Skin is warm and dry.  Neurological:     General: No focal deficit present.     Mental Status: He is alert and oriented to person, place, and time. Mental status is at baseline.  Psychiatric:        Mood and Affect: Mood normal.        Behavior: Behavior normal.        Thought Content: Thought  content normal.        Judgment: Judgment normal.     Results for orders placed or performed in visit on 07/27/23  POCT HgB A1C   Collection Time: 07/27/23  7:45 AM  Result Value Ref Range   Hemoglobin A1C 6.4 (A) 4.0 - 5.6 %   HbA1c POC (<> result, manual entry)     HbA1c, POC (prediabetic range)     HbA1c, POC (controlled diabetic range)         Assessment & Plan:   Problem List Items Addressed This Visit       Cardiovascular and Mediastinum   Essential hypertension - Primary     Endocrine   Type 2 diabetes mellitus with hyperglycemia, without long-term current use of insulin (HCC)   Relevant Orders   POCT HgB A1C (Completed)     Other   Mixed hyperlipidemia   Class 3 severe obesity due to excess calories with serious comorbidity and body mass index (BMI) of 40.0 to 44.9 in adult (HCC)   Moderate episode of recurrent major depressive disorder (HCC)   GAD (generalized anxiety disorder)    Assessment and Plan Assessment & Plan Type 2 Diabetes Mellitus/obesity Type 2 diabetes is well-controlled with an HbA1c of 6.4%, below the 7% threshold for concern. He tolerates Ozempic well, contributing to effective management. - Continue Ozempic 2 grams weekly.  Hypertension Hypertension is well-controlled with a blood pressure of 126/84 mmHg. He reports no issues since the last visit. - Continue telmisartan-hydrochlorothiazide 40-12.5 mg daily.  Depression Depression is well-managed with Lexapro. He reports doing well on the current regimen. - Continue Lexapro 10 mg daily.  HLD -continue working on lifestyle modification    Follow up plan: Return in about 4 months (around 11/26/2023) for follow up.

## 2023-08-18 ENCOUNTER — Other Ambulatory Visit: Payer: Self-pay | Admitting: Nurse Practitioner

## 2023-08-18 ENCOUNTER — Other Ambulatory Visit: Payer: Self-pay

## 2023-08-18 DIAGNOSIS — E1165 Type 2 diabetes mellitus with hyperglycemia: Secondary | ICD-10-CM

## 2023-08-18 MED FILL — Semaglutide Soln Pen-inj 2 MG/DOSE (8 MG/3ML): SUBCUTANEOUS | 28 days supply | Qty: 3 | Fill #0 | Status: CN

## 2023-08-18 NOTE — Telephone Encounter (Signed)
 Requested Prescriptions  Pending Prescriptions Disp Refills   Semaglutide, 2 MG/DOSE, (OZEMPIC, 2 MG/DOSE,) 8 MG/3ML SOPN [Pharmacy Med Name: Semaglutide, 2 MG/DOSE, 8 MG/3ML Solution Pen-injector] 3 mL 2    Sig: Inject 2 mg as directed once a week.     Endocrinology:  Diabetes - GLP-1 Receptor Agonists - semaglutide Failed - 08/18/2023  5:48 PM      Failed - HBA1C in normal range and within 180 days    Hemoglobin A1C  Date Value Ref Range Status  07/27/2023 6.4 (A) 4.0 - 5.6 % Final   Hgb A1c MFr Bld  Date Value Ref Range Status  04/28/2023 6.1 (H) <5.7 % of total Hgb Final    Comment:    For someone without known diabetes, a hemoglobin  A1c value between 5.7% and 6.4% is consistent with prediabetes and should be confirmed with a  follow-up test. . For someone with known diabetes, a value <7% indicates that their diabetes is well controlled. A1c targets should be individualized based on duration of diabetes, age, comorbid conditions, and other considerations. . This assay result is consistent with an increased risk of diabetes. . Currently, no consensus exists regarding use of hemoglobin A1c for diagnosis of diabetes for children. .          Passed - Cr in normal range and within 360 days    Creat  Date Value Ref Range Status  04/28/2023 1.01 0.60 - 1.26 mg/dL Final   Creatinine, Urine  Date Value Ref Range Status  10/31/2022 298 20 - 320 mg/dL Final         Passed - Valid encounter within last 6 months    Recent Outpatient Visits           3 weeks ago Essential hypertension   [facility] Berniece Salines, FNP       Future Appointments             In 3 months Zane Herald, Rudolpho Sevin, FNP [facility], [facility]

## 2023-08-21 ENCOUNTER — Other Ambulatory Visit: Payer: Self-pay

## 2023-08-21 MED FILL — Semaglutide Soln Pen-inj 2 MG/DOSE (8 MG/3ML): SUBCUTANEOUS | 28 days supply | Qty: 3 | Fill #0 | Status: AC

## 2023-09-17 MED FILL — Semaglutide Soln Pen-inj 2 MG/DOSE (8 MG/3ML): SUBCUTANEOUS | 28 days supply | Qty: 3 | Fill #1 | Status: AC

## 2023-10-07 ENCOUNTER — Other Ambulatory Visit: Payer: Self-pay | Admitting: Nurse Practitioner

## 2023-10-07 DIAGNOSIS — I10 Essential (primary) hypertension: Secondary | ICD-10-CM

## 2023-10-09 ENCOUNTER — Other Ambulatory Visit: Payer: Self-pay

## 2023-10-09 ENCOUNTER — Other Ambulatory Visit: Payer: Self-pay | Admitting: Nurse Practitioner

## 2023-10-09 DIAGNOSIS — I10 Essential (primary) hypertension: Secondary | ICD-10-CM

## 2023-10-10 ENCOUNTER — Other Ambulatory Visit: Payer: Self-pay

## 2023-10-10 MED FILL — Telmisartan-Hydrochlorothiazide Tab 40-12.5 MG: ORAL | 90 days supply | Qty: 90 | Fill #0 | Status: AC

## 2023-10-10 NOTE — Telephone Encounter (Signed)
 Requested Prescriptions  Pending Prescriptions Disp Refills   telmisartan -hydrochlorothiazide  (MICARDIS  HCT) 40-12.5 MG tablet 90 tablet 1    Sig: Take 1 tablet by mouth daily.     Cardiovascular: ARB + Diuretic Combos Passed - 10/10/2023 12:44 PM      Passed - K in normal range and within 180 days    Potassium  Date Value Ref Range Status  04/28/2023 4.2 3.5 - 5.3 mmol/L Final         Passed - Na in normal range and within 180 days    Sodium  Date Value Ref Range Status  04/28/2023 140 135 - 146 mmol/L Final         Passed - Cr in normal range and within 180 days    Creat  Date Value Ref Range Status  04/28/2023 1.01 0.60 - 1.26 mg/dL Final   Creatinine, Urine  Date Value Ref Range Status  10/31/2022 298 20 - 320 mg/dL Final         Passed - eGFR is 10 or above and within 180 days    eGFR  Date Value Ref Range Status  04/28/2023 98 > OR = 60 mL/min/1.10m2 Final         Passed - Patient is not pregnant      Passed - Last BP in normal range    BP Readings from Last 1 Encounters:  07/27/23 126/84         Passed - Valid encounter within last 6 months    Recent Outpatient Visits           2 months ago Essential hypertension   [facility] Quinton Buckler, FNP       Future Appointments             In 1 month Abram Hoguet, Monalisa Angles, FNP [facility], [facility]

## 2023-10-13 MED FILL — Semaglutide Soln Pen-inj 2 MG/DOSE (8 MG/3ML): SUBCUTANEOUS | 28 days supply | Qty: 3 | Fill #2 | Status: AC

## 2023-11-13 ENCOUNTER — Other Ambulatory Visit: Payer: Self-pay | Admitting: Nurse Practitioner

## 2023-11-13 ENCOUNTER — Other Ambulatory Visit: Payer: Self-pay

## 2023-11-13 DIAGNOSIS — E1165 Type 2 diabetes mellitus with hyperglycemia: Secondary | ICD-10-CM

## 2023-11-14 ENCOUNTER — Other Ambulatory Visit: Payer: Self-pay

## 2023-11-15 ENCOUNTER — Other Ambulatory Visit: Payer: Self-pay

## 2023-11-15 MED FILL — Semaglutide Soln Pen-inj 2 MG/DOSE (8 MG/3ML): SUBCUTANEOUS | 28 days supply | Qty: 3 | Fill #0 | Status: AC

## 2023-11-15 NOTE — Telephone Encounter (Signed)
 Requested Prescriptions  Pending Prescriptions Disp Refills   Semaglutide , 2 MG/DOSE, (OZEMPIC , 2 MG/DOSE,) 8 MG/3ML SOPN [Pharmacy Med Name: Semaglutide , 2 MG/DOSE, (OZEMPIC , 2 MG/DOSE,) 8 MG/3ML Solution Pen-injector] 3 mL 0    Sig: Inject 2 mg as directed once a week.     Endocrinology:  Diabetes - GLP-1 Receptor Agonists - semaglutide  Failed - 11/15/2023 10:52 AM      Failed - HBA1C in normal range and within 180 days    Hemoglobin A1C  Date Value Ref Range Status  07/27/2023 6.4 (A) 4.0 - 5.6 % Final   Hgb A1c MFr Bld  Date Value Ref Range Status  04/28/2023 6.1 (H) <5.7 % of total Hgb Final    Comment:    For someone without known diabetes, a hemoglobin  A1c value between 5.7% and 6.4% is consistent with prediabetes and should be confirmed with a  follow-up test. . For someone with known diabetes, a value <7% indicates that their diabetes is well controlled. A1c targets should be individualized based on duration of diabetes, age, comorbid conditions, and other considerations. . This assay result is consistent with an increased risk of diabetes. . Currently, no consensus exists regarding use of hemoglobin A1c for diagnosis of diabetes for children. .          Passed - Cr in normal range and within 360 days    Creat  Date Value Ref Range Status  04/28/2023 1.01 0.60 - 1.26 mg/dL Final   Creatinine, Urine  Date Value Ref Range Status  10/31/2022 298 20 - 320 mg/dL Final         Passed - Valid encounter within last 6 months    Recent Outpatient Visits           3 months ago Essential hypertension   [facility] Gareth Mliss FALCON, FNP       Future Appointments             In 1 week Gareth, Mliss FALCON, FNP [facility], [facility]

## 2023-11-16 ENCOUNTER — Other Ambulatory Visit: Payer: Self-pay

## 2023-11-27 NOTE — Progress Notes (Signed)
 BP 124/82   Pulse 84   Resp 16   Ht 6' (1.829 m)   Wt (!) 307 lb (139.3 kg)   SpO2 98%   BMI 41.64 kg/m    Subjective:    Patient ID: Brett Burke, male    DOB: Mar 15, 1986, 38 y.o.   MRN: [id number]  HPI: Brett Burke is a 38 y.o. male  Chief Complaint  Patient presents with   Medical Management of Chronic Issues    Discussed the use of AI scribe software for clinical note transcription with the patient, who gave verbal consent to proceed.  History of Present Illness Brett Burke is a 38 year old male with hypertension, type 2 diabetes, hyperlipidemia, obesity, depression, and anxiety who presents for a routine follow-up.  Glycemic control and hypoglycemic symptoms - Type 2 diabetes managed with Ozempic  2 mg weekly - Recent HbA1c 6.4% - Occasional self-monitoring of blood glucose, typically in the 120s - Experiences hypoglycemic symptoms after exertion with blood glucose readings in the 80s, which resolve after eating - Not eating much during work hours, increased intake at home - Not closely monitoring diet - No foot sores; regularly checks feet  Weight gain and obesity - Weight increased from 297 pounds (BMI 40.35) to 307 pounds (BMI 41.64)  Hypertension - Hypertension managed with telmisartan -hydrochlorothiazide  40-12.5 mg daily - Not monitoring blood pressure at home - No headaches  Mood disturbance and anxiety - Depression and anxiety managed with Lexapro  10 mg daily - Mood improved since resuming Lexapro  1.5 months ago after a period of discontinuation - Describes feeling 'numb' on Lexapro  but experiences better stress and emotional management  Hyperlipidemia - History of hyperlipidemia;getting labs today    Starting weight:297 lbs Starting BMI: 40.35 Current weight: 307 lbs Current BMI: 41.64 Waist Measurement : 44 inches  - Encourage continuation of lifestyle modifications, including dietary management and regular exercise. -continue to  increase physical activity, getting at least 150 min of physical activity a week.  Work on including Runner, broadcasting/film/video 2 days a week.  - continue eating at a calorie deficit 2200-2400 cal a day, eating a well balanced diet with whole foods, avoiding processed foods.    Patient is motivated to continue to work on lifestyle modification.      11/28/2023    8:01 AM 07/27/2023    7:40 AM 04/28/2023    7:39 AM  Depression screen PHQ 2/9  Decreased Interest 0 0 0  Down, Depressed, Hopeless 0 0 0  PHQ - 2 Score 0 0 0  Altered sleeping 0 0 0  Tired, decreased energy 0 0 0  Change in appetite 0 0 0  Feeling bad or failure about yourself  0 0 0  Trouble concentrating 0 0 0  Moving slowly or fidgety/restless 0 0 0  Suicidal thoughts 0 0 0  PHQ-9 Score 0 0 0  Difficult doing work/chores  Not difficult at all Not difficult at all       07/27/2023    7:41 AM 04/28/2023    7:40 AM 11/29/2022    7:38 AM 10/31/2022    8:00 AM  GAD 7 : Generalized Anxiety Score  Nervous, Anxious, on Edge 0 0 0 1  Control/stop worrying 0 0 0 2  Worry too much - different things 0 0 0 2  Trouble relaxing 0 0 0 2  Restless 0 0 0 1  Easily annoyed or irritable 0 0 0 3  Afraid - awful might happen  0 0 0 1  Total GAD 7 Score 0 0 0 12  Anxiety Difficulty Not difficult at all Not difficult at all Not difficult at all Very difficult     Relevant past medical, surgical, family and social history reviewed and updated as indicated. Interim medical history since our last visit reviewed. Allergies and medications reviewed and updated.  Review of Systems  Constitutional: Negative for fever or weight change.  Respiratory: Negative for cough and shortness of breath.   Cardiovascular: Negative for chest pain or palpitations.  Gastrointestinal: Negative for abdominal pain, no bowel changes.  Musculoskeletal: Negative for gait problem or joint swelling.  Skin: Negative for rash.  Neurological: Negative for dizziness or  headache.  No other specific complaints in a complete review of systems (except as listed in HPI above).      Objective:     BP 124/82   Pulse 84   Resp 16   Ht 6' (1.829 m)   Wt (!) 307 lb (139.3 kg)   SpO2 98%   BMI 41.64 kg/m    Wt Readings from Last 3 Encounters:  11/28/23 (!) 307 lb (139.3 kg)  07/27/23 (!) 300 lb 3.2 oz (136.2 kg)  05/23/23 (!) 304 lb 8 oz (138.1 kg)    Physical Exam Physical Exam VITALS: BP- 124/82 MEASUREMENTS: Weight- 307, BMI- 41.64. GENERAL: Alert, cooperative, well developed, no acute distress. HEENT: Normocephalic, normal oropharynx, moist mucous membranes. CHEST: Clear to auscultation bilaterally, no wheezes, rhonchi, or crackles. CARDIOVASCULAR: Normal heart rate and rhythm, S1 and S2 normal without murmurs. ABDOMEN: Soft, non-tender, non-distended, without organomegaly, normal bowel sounds. EXTREMITIES: No cyanosis or edema, feet examined with no sores. NEUROLOGICAL: Cranial nerves grossly intact, moves all extremities without gross motor or sensory deficit.   Diabetic Foot Exam - Simple   Simple Foot Form Diabetic Foot exam was performed with the following findings: Yes 11/28/2023  8:15 AM  Visual Inspection No deformities, no ulcerations, no other skin breakdown bilaterally: Yes Sensation Testing Intact to touch and monofilament testing bilaterally: Yes Pulse Check Posterior Tibialis and Dorsalis pulse intact bilaterally: Yes Comments     Results for orders placed or performed in visit on 07/27/23  POCT HgB A1C   Collection Time: 07/27/23  7:45 AM  Result Value Ref Range   Hemoglobin A1C 6.4 (A) 4.0 - 5.6 %   HbA1c POC (<> result, manual entry)     HbA1c, POC (prediabetic range)     HbA1c, POC (controlled diabetic range)            Assessment & Plan:   Problem List Items Addressed This Visit       Cardiovascular and Mediastinum   Essential hypertension - Primary   Relevant Orders   CBC with Differential/Platelet    Comprehensive metabolic panel with GFR     Endocrine   Type 2 diabetes mellitus with hyperglycemia, without long-term current use of insulin (HCC)   Relevant Orders   Comprehensive metabolic panel with GFR   Hemoglobin A1c   Microalbumin / creatinine urine ratio   HM Diabetes Foot Exam (Completed)     Other   Mixed hyperlipidemia   Relevant Orders   Lipid panel   Class 3 severe obesity due to excess calories with serious comorbidity and body mass index (BMI) of 40.0 to 44.9 in adult   Moderate episode of recurrent major depressive disorder (HCC)   Relevant Medications   escitalopram  (LEXAPRO ) 10 MG tablet   GAD (generalized anxiety disorder)  Relevant Medications   escitalopram  (LEXAPRO ) 10 MG tablet     Assessment and Plan Assessment & Plan Type 2 diabetes mellitus Type 2 diabetes mellitus with recent A1c of 6.4, indicating good control. Blood glucose levels generally in the 120s, with occasional hypoglycemic episodes in the 80s, particularly after physical exertion. No current dietary restrictions noted, and inconsistent meal patterns during work hours. - Order microalbumin urine test - Perform diabetic foot exam -continue ozempic  2 mg weekly  Hypertension Hypertension is well-controlled with current blood pressure reading of 124/82 mmHg. No home blood pressure monitoring reported, and no symptoms such as headaches noted. -continue telmisartan -hydrochlorothiazide  40-12.5 mg daily  Hyperlipidemia Hyperlipidemia with last LDL level of 126 mg/dL.  Obesity Obesity with current weight of 307 pounds and BMI of 41.64, increased from previous weight of 297 pounds. - Measure waist circumference as a new health indicator - Encourage continuation of lifestyle modifications, including dietary management and regular exercise. -continue to increase physical activity, getting at least 150 min of physical activity a week.  Work on including Runner, broadcasting/film/video 2 days a week.  - continue  eating at a calorie deficit 2200-2400 cal a day, eating a well balanced diet with whole foods, avoiding processed foods.   Patient is motivated to continue working on lifestyle modification.    Depression and anxiety disorder Depression and anxiety disorder currently managed with Lexapro  10 mg daily. Experienced numbness and emotional blunting on medication, leading to a temporary discontinuation. Symptoms worsened off medication, prompting resumption. Currently considering dose adjustment to half a tablet to mitigate side effects while maintaining efficacy. Discussed the importance of maintaining emotional expression while on medication. - Refill Lexapro  prescription - Consider reducing Lexapro  dose to half a tablet if side effects persist        Follow up plan: Return in about 4 months (around 03/30/2024) for follow up.

## 2023-11-28 ENCOUNTER — Encounter: Payer: Self-pay | Admitting: Nurse Practitioner

## 2023-11-28 ENCOUNTER — Ambulatory Visit: Admitting: Nurse Practitioner

## 2023-11-28 ENCOUNTER — Other Ambulatory Visit: Payer: Self-pay

## 2023-11-28 VITALS — BP 124/82 | HR 84 | Resp 16 | Ht 72.0 in | Wt 307.0 lb

## 2023-11-28 DIAGNOSIS — E1165 Type 2 diabetes mellitus with hyperglycemia: Secondary | ICD-10-CM | POA: Diagnosis not present

## 2023-11-28 DIAGNOSIS — E782 Mixed hyperlipidemia: Secondary | ICD-10-CM | POA: Diagnosis not present

## 2023-11-28 DIAGNOSIS — E66813 Obesity, class 3: Secondary | ICD-10-CM | POA: Diagnosis not present

## 2023-11-28 DIAGNOSIS — Z6841 Body Mass Index (BMI) 40.0 and over, adult: Secondary | ICD-10-CM

## 2023-11-28 DIAGNOSIS — I10 Essential (primary) hypertension: Secondary | ICD-10-CM

## 2023-11-28 DIAGNOSIS — F411 Generalized anxiety disorder: Secondary | ICD-10-CM

## 2023-11-28 DIAGNOSIS — F331 Major depressive disorder, recurrent, moderate: Secondary | ICD-10-CM | POA: Diagnosis not present

## 2023-11-28 MED ORDER — ESCITALOPRAM OXALATE 10 MG PO TABS
10.0000 mg | ORAL_TABLET | Freq: Every day | ORAL | 1 refills | Status: AC
  Filled 2023-11-28 – 2023-12-08 (×2): qty 90, 90d supply, fill #0
  Filled 2024-03-29: qty 90, 90d supply, fill #1

## 2023-11-29 ENCOUNTER — Ambulatory Visit: Payer: Self-pay | Admitting: Nurse Practitioner

## 2023-11-29 LAB — COMPREHENSIVE METABOLIC PANEL WITH GFR
AG Ratio: 1.9 (calc) (ref 1.0–2.5)
ALT: 59 U/L — ABNORMAL HIGH (ref 9–46)
AST: 33 U/L (ref 10–40)
Albumin: 4.4 g/dL (ref 3.6–5.1)
Alkaline phosphatase (APISO): 59 U/L (ref 36–130)
BUN: 18 mg/dL (ref 7–25)
CO2: 27 mmol/L (ref 20–32)
Calcium: 9.2 mg/dL (ref 8.6–10.3)
Chloride: 105 mmol/L (ref 98–110)
Creat: 1.22 mg/dL (ref 0.60–1.26)
Globulin: 2.3 g/dL (ref 1.9–3.7)
Glucose, Bld: 131 mg/dL — ABNORMAL HIGH (ref 65–99)
Potassium: 4.6 mmol/L (ref 3.5–5.3)
Sodium: 139 mmol/L (ref 135–146)
Total Bilirubin: 0.6 mg/dL (ref 0.2–1.2)
Total Protein: 6.7 g/dL (ref 6.1–8.1)
eGFR: 78 mL/min/1.73m2 (ref >=60)

## 2023-11-29 LAB — CBC WITH DIFFERENTIAL/PLATELET
Absolute Lymphocytes: 2089 {cells}/uL (ref 850–3900)
Absolute Monocytes: 360 {cells}/uL (ref 200–950)
Basophils Absolute: 19 {cells}/uL (ref 0–200)
Basophils Relative: 0.3 %
Eosinophils Absolute: 81 {cells}/uL (ref 15–500)
Eosinophils Relative: 1.3 %
HCT: 47.6 % (ref 38.5–50.0)
Hemoglobin: 15.8 g/dL (ref 13.2–17.1)
MCH: 31.4 pg (ref 27.0–33.0)
MCHC: 33.2 g/dL (ref 32.0–36.0)
MCV: 94.6 fL (ref 80.0–100.0)
MPV: 10 fL (ref 7.5–12.5)
Monocytes Relative: 5.8 %
Neutro Abs: 3652 {cells}/uL (ref 1500–7800)
Neutrophils Relative %: 58.9 %
Platelets: 201 Thousand/uL (ref 140–400)
RBC: 5.03 Million/uL (ref 4.20–5.80)
RDW: 12.6 % (ref 11.0–15.0)
Total Lymphocyte: 33.7 %
WBC: 6.2 Thousand/uL (ref 3.8–10.8)

## 2023-11-29 LAB — HEMOGLOBIN A1C
Hgb A1c MFr Bld: 6.4 % — ABNORMAL HIGH (ref <5.7)
Mean Plasma Glucose: 137 mg/dL
eAG (mmol/L): 7.6 mmol/L

## 2023-11-29 LAB — LIPID PANEL
Cholesterol: 213 mg/dL — ABNORMAL HIGH (ref <200)
HDL: 42 mg/dL (ref >=40)
LDL Cholesterol (Calc): 147 mg/dL — ABNORMAL HIGH
Non-HDL Cholesterol (Calc): 171 mg/dL — ABNORMAL HIGH (ref <130)
Total CHOL/HDL Ratio: 5.1 (calc) — ABNORMAL HIGH (ref <5.0)
Triglycerides: 118 mg/dL (ref <150)

## 2023-11-29 LAB — MICROALBUMIN / CREATININE URINE RATIO
Creatinine, Urine: 151 mg/dL (ref 20–320)
Microalb Creat Ratio: 3 mg/g{creat} (ref <30)
Microalb, Ur: 0.4 mg/dL

## 2023-12-06 ENCOUNTER — Other Ambulatory Visit: Payer: Self-pay

## 2023-12-06 ENCOUNTER — Other Ambulatory Visit: Payer: Self-pay | Admitting: Nurse Practitioner

## 2023-12-06 DIAGNOSIS — E1165 Type 2 diabetes mellitus with hyperglycemia: Secondary | ICD-10-CM

## 2023-12-06 MED FILL — Telmisartan-Hydrochlorothiazide Tab 40-12.5 MG: ORAL | 90 days supply | Qty: 90 | Fill #1 | Status: CN

## 2023-12-07 ENCOUNTER — Other Ambulatory Visit: Payer: Self-pay

## 2023-12-07 ENCOUNTER — Other Ambulatory Visit: Payer: Self-pay | Admitting: Nurse Practitioner

## 2023-12-07 DIAGNOSIS — E1165 Type 2 diabetes mellitus with hyperglycemia: Secondary | ICD-10-CM

## 2023-12-08 ENCOUNTER — Other Ambulatory Visit: Payer: Self-pay

## 2023-12-08 MED FILL — Telmisartan-Hydrochlorothiazide Tab 40-12.5 MG: ORAL | 90 days supply | Qty: 90 | Fill #1 | Status: CN

## 2023-12-08 MED FILL — Semaglutide Soln Pen-inj 2 MG/DOSE (8 MG/3ML): SUBCUTANEOUS | 28 days supply | Qty: 3 | Fill #0 | Status: AC

## 2023-12-08 NOTE — Telephone Encounter (Signed)
 Requested Prescriptions  Pending Prescriptions Disp Refills   Semaglutide , 2 MG/DOSE, (OZEMPIC , 2 MG/DOSE,) 8 MG/3ML SOPN [Pharmacy Med Name: Semaglutide , 2 MG/DOSE, (OZEMPIC , 2 MG/DOSE,) 8 MG/3ML Solution Pen-injector] 3 mL 2 RF    Sig: Inject 2 mg into the skin once a week.     Endocrinology:  Diabetes - GLP-1 Receptor Agonists - semaglutide  Failed - 12/08/2023 10:41 AM      Failed - HBA1C in normal range and within 180 days    Hgb A1c MFr Bld  Date Value Ref Range Status  11/28/2023 6.4 (H) <5.7 % Final    Comment:    For someone without known diabetes, a hemoglobin  A1c value between 5.7% and 6.4% is consistent with prediabetes and should be confirmed with a  follow-up test. . For someone with known diabetes, a value <7% indicates that their diabetes is well controlled. A1c targets should be individualized based on duration of diabetes, age, comorbid conditions, and other considerations. . This assay result is consistent with an increased risk of diabetes. . Currently, no consensus exists regarding use of hemoglobin A1c for diagnosis of diabetes for children. .          Passed - Cr in normal range and within 360 days    Creat  Date Value Ref Range Status  11/28/2023 1.22 0.60 - 1.26 mg/dL Final   Creatinine, Urine  Date Value Ref Range Status  11/28/2023 151 20 - 320 mg/dL Final         Passed - Valid encounter within last 6 months    Recent Outpatient Visits           1 week ago Essential hypertension   [facility] Gareth Mliss FALCON, FNP   4 months ago Essential hypertension   [facility] Gareth Mliss FALCON, FNP       Future Appointments             In 3 months Gareth, Mliss FALCON, FNP [facility], [facility]

## 2023-12-11 ENCOUNTER — Other Ambulatory Visit: Payer: Self-pay

## 2023-12-11 MED FILL — Telmisartan-Hydrochlorothiazide Tab 40-12.5 MG: ORAL | 90 days supply | Qty: 90 | Fill #1 | Status: CN

## 2023-12-13 ENCOUNTER — Other Ambulatory Visit: Payer: Self-pay

## 2023-12-13 MED FILL — Telmisartan-Hydrochlorothiazide Tab 40-12.5 MG: ORAL | 90 days supply | Qty: 90 | Fill #1 | Status: CN

## 2024-01-07 MED FILL — Telmisartan-Hydrochlorothiazide Tab 40-12.5 MG: ORAL | 90 days supply | Qty: 90 | Fill #1 | Status: AC

## 2024-01-07 MED FILL — Semaglutide Soln Pen-inj 2 MG/DOSE (8 MG/3ML): SUBCUTANEOUS | 28 days supply | Qty: 3 | Fill #1 | Status: AC

## 2024-02-03 MED FILL — Semaglutide Soln Pen-inj 2 MG/DOSE (8 MG/3ML): SUBCUTANEOUS | 28 days supply | Qty: 3 | Fill #2 | Status: AC

## 2024-03-01 ENCOUNTER — Other Ambulatory Visit: Payer: Self-pay | Admitting: Nurse Practitioner

## 2024-03-01 DIAGNOSIS — E1165 Type 2 diabetes mellitus with hyperglycemia: Secondary | ICD-10-CM

## 2024-03-04 ENCOUNTER — Other Ambulatory Visit: Payer: Self-pay

## 2024-03-04 MED ORDER — OZEMPIC (2 MG/DOSE) 8 MG/3ML ~~LOC~~ SOPN
2.0000 mg | PEN_INJECTOR | SUBCUTANEOUS | 2 refills | Status: DC
  Filled 2024-03-04: qty 3, 28d supply, fill #0
  Filled 2024-03-29: qty 3, 28d supply, fill #1
  Filled 2024-04-26: qty 3, 28d supply, fill #2

## 2024-03-04 NOTE — Telephone Encounter (Signed)
 Requested Prescriptions  Pending Prescriptions Disp Refills   Semaglutide , 2 MG/DOSE, (OZEMPIC , 2 MG/DOSE,) 8 MG/3ML SOPN 3 mL 2    Sig: Inject 2 mg into the skin once a week.     Endocrinology:  Diabetes - GLP-1 Receptor Agonists - semaglutide  Failed - 03/04/2024  8:18 AM      Failed - HBA1C in normal range and within 180 days    Hgb A1c MFr Bld  Date Value Ref Range Status  11/28/2023 6.4 (H) <5.7 % Final    Comment:    For someone without known diabetes, a hemoglobin  A1c value between 5.7% and 6.4% is consistent with prediabetes and should be confirmed with a  follow-up test. . For someone with known diabetes, a value <7% indicates that their diabetes is well controlled. A1c targets should be individualized based on duration of diabetes, age, comorbid conditions, and other considerations. . This assay result is consistent with an increased risk of diabetes. . Currently, no consensus exists regarding use of hemoglobin A1c for diagnosis of diabetes for children. .          Passed - Cr in normal range and within 360 days    Creat  Date Value Ref Range Status  11/28/2023 1.22 0.60 - 1.26 mg/dL Final   Creatinine, Urine  Date Value Ref Range Status  11/28/2023 151 20 - 320 mg/dL Final         Passed - Valid encounter within last 6 months    Recent Outpatient Visits           3 months ago Essential hypertension   [facility] Gareth Mliss FALCON, FNP   7 months ago Essential hypertension   [facility] Gareth Mliss FALCON, FNP       Future Appointments             In 4 weeks Gareth, Mliss FALCON, FNP [facility], [place]

## 2024-04-01 ENCOUNTER — Ambulatory Visit: Admitting: Nurse Practitioner

## 2024-04-01 ENCOUNTER — Encounter: Payer: Self-pay | Admitting: Nurse Practitioner

## 2024-04-01 VITALS — BP 130/88 | HR 44 | Temp 98.7°F | Ht 72.0 in | Wt 321.0 lb

## 2024-04-01 DIAGNOSIS — E1165 Type 2 diabetes mellitus with hyperglycemia: Secondary | ICD-10-CM | POA: Diagnosis not present

## 2024-04-01 DIAGNOSIS — E782 Mixed hyperlipidemia: Secondary | ICD-10-CM | POA: Diagnosis not present

## 2024-04-01 DIAGNOSIS — I10 Essential (primary) hypertension: Secondary | ICD-10-CM | POA: Diagnosis not present

## 2024-04-01 DIAGNOSIS — E66813 Obesity, class 3: Secondary | ICD-10-CM

## 2024-04-01 DIAGNOSIS — F331 Major depressive disorder, recurrent, moderate: Secondary | ICD-10-CM

## 2024-04-01 DIAGNOSIS — F411 Generalized anxiety disorder: Secondary | ICD-10-CM

## 2024-04-01 DIAGNOSIS — Z6841 Body Mass Index (BMI) 40.0 and over, adult: Secondary | ICD-10-CM

## 2024-04-01 DIAGNOSIS — Z7985 Long-term (current) use of injectable non-insulin antidiabetic drugs: Secondary | ICD-10-CM | POA: Diagnosis not present

## 2024-04-01 LAB — POCT GLYCOSYLATED HEMOGLOBIN (HGB A1C): Hemoglobin A1C: 6.2 % — AB (ref 4.0–5.6)

## 2024-04-01 NOTE — Progress Notes (Signed)
 BP 130/88   Pulse (!) 44   Temp 98.7 F (37.1 C)   Ht 6' (1.829 m)   Wt (!) 321 lb (145.6 kg)   SpO2 97%   BMI 43.54 kg/m    Subjective:    Patient ID: Brett Burke, male    DOB: [birth year], 38 y.o.   MRN: [id number]  HPI: Brett Burke is a 38 y.o. male  Chief Complaint  Patient presents with   Medical Management of Chronic Issues   Discussed the use of AI scribe software for clinical note transcription with the patient, who gave verbal consent to proceed.  History of Present Illness Brett Burke is a 38 year old male with hypertension, type two diabetes, obesity, hyperlipidemia, depression, and anxiety who presents for a four month follow-up.  Glycemic control - Type 2 diabetes managed with Ozempic  2 mg weekly - Current A1c is 6.2, previously 6.4 - Does not monitor blood glucose at home - Tolerates Ozempic  well, with a single episode of discomfort after overeating at a 'friendsgiving' event  Blood pressure management - Hypertension managed with telmisartan -hydrochlorothiazide  40-12.5 mg daily - Blood pressure elevated today at 138/104, with a subsequent reading of 130/88 - Blood pressure has been well-controlled in the past BP Readings from Last 3 Encounters:  04/01/24 130/88  11/28/23 124/82  07/27/23 126/84    Lipid abnormalities - LDL cholesterol is 147 Lipid Panel     Component Value Date/Time   CHOL 213 (H) 11/28/2023 0826   TRIG 118 11/28/2023 0826   HDL 42 11/28/2023 0826   CHOLHDL 5.1 (H) 11/28/2023 0826   LDLCALC 147 (H) 11/28/2023 0826     Mood and anxiety symptoms - Depression and anxiety managed with Lexapro  10 mg daily - No issues with current medication regimen  Obesity - Obesity present, managed as part of overall care Wt Readings from Last 3 Encounters:  04/01/24 (!) 321 lb (145.6 kg)  11/28/23 (!) 307 lb (139.3 kg)  07/27/23 (!) 300 lb 3.2 oz (136.2 kg)   Body mass index is 43.54 kg/m.  Flowsheet Row Office Visit from  04/01/2024 in [facility] Office Visit from 11/28/2023 in [facility]  1 44 inches 44 inches   - Encourage continuation of lifestyle modifications, including dietary management and regular exercise. -continue to increase physical activity, getting at least 150 min of physical activity a week.  Work on including runner, broadcasting/film/video 2 days a week.  - continue eating at a calorie deficit 2000-2200 cal a day, eating a well balanced diet with whole foods, avoiding processed foods.   Patient is motivated to continue working on lifestyle modification.           04/01/2024    8:05 AM 11/28/2023    8:01 AM 07/27/2023    7:40 AM  Depression screen PHQ 2/9  Decreased Interest 1 0 0  Down, Depressed, Hopeless 1 0 0  PHQ - 2 Score 2 0 0  Altered sleeping 0 0 0  Tired, decreased energy 1 0 0  Change in appetite 2 0 0  Feeling bad or failure about yourself  1 0 0  Trouble concentrating 1 0 0  Moving slowly or fidgety/restless 0 0 0  Suicidal thoughts 0 0 0  PHQ-9 Score 7 0  0   Difficult doing work/chores Somewhat difficult  Not difficult at all     Data saved with a previous flowsheet row definition  04/01/2024    8:05 AM 07/27/2023    7:41 AM 04/28/2023    7:40 AM 11/29/2022    7:38 AM  GAD 7 : Generalized Anxiety Score  Nervous, Anxious, on Edge 0 0 0 0  Control/stop worrying 0 0 0 0  Worry too much - different things 1 0 0 0  Trouble relaxing 0 0 0 0  Restless 0 0 0 0  Easily annoyed or irritable 1 0 0 0  Afraid - awful might happen 0 0 0 0  Total GAD 7 Score 2 0 0 0  Anxiety Difficulty  Not difficult at all Not difficult at all Not difficult at all     Relevant past medical, surgical, family and social history reviewed and updated as indicated. Interim medical history since our last visit reviewed. Allergies and medications reviewed and updated.  Review of Systems  Constitutional: Negative for fever or weight change.   Respiratory: Negative for cough and shortness of breath.   Cardiovascular: Negative for chest pain or palpitations.  Gastrointestinal: Negative for abdominal pain, no bowel changes.  Musculoskeletal: Negative for gait problem or joint swelling.  Skin: Negative for rash.  Neurological: Negative for dizziness or headache.  No other specific complaints in a complete review of systems (except as listed in HPI above).      Objective:      BP 130/88   Pulse (!) 44   Temp 98.7 F (37.1 C)   Ht 6' (1.829 m)   Wt (!) 321 lb (145.6 kg)   SpO2 97%   BMI 43.54 kg/m    Wt Readings from Last 3 Encounters:  04/01/24 (!) 321 lb (145.6 kg)  11/28/23 (!) 307 lb (139.3 kg)  07/27/23 (!) 300 lb 3.2 oz (136.2 kg)    Physical Exam VITALS: BP- 130/88 GENERAL: Alert, cooperative, well developed, no acute distress HEENT: Normocephalic, normal oropharynx, moist mucous membranes CHEST: Clear to auscultation bilaterally, No wheezes, rhonchi, or crackles CARDIOVASCULAR: Normal heart rate and rhythm, S1 and S2 normal without murmurs ABDOMEN: Soft, non-tender, non-distended, without organomegaly, Normal bowel sounds EXTREMITIES: No cyanosis or edema NEUROLOGICAL: Cranial nerves grossly intact, Moves all extremities without gross motor or sensory deficit  Results for orders placed or performed in visit on 04/01/24  POCT HgB A1C   Collection Time: 04/01/24  8:12 AM  Result Value Ref Range   Hemoglobin A1C 6.2 (A) 4.0 - 5.6 %   HbA1c POC (<> result, manual entry)     HbA1c, POC (prediabetic range)     HbA1c, POC (controlled diabetic range)            Assessment & Plan:   Problem List Items Addressed This Visit       Cardiovascular and Mediastinum   Essential hypertension     Endocrine   Type 2 diabetes mellitus with hyperglycemia, without long-term current use of insulin (HCC) - Primary   Relevant Orders   POCT HgB A1C (Completed)   Ambulatory referral to Ophthalmology     Other    Mixed hyperlipidemia   Class 3 severe obesity due to excess calories with serious comorbidity and body mass index (BMI) of 40.0 to 44.9 in adult (HCC)   Moderate episode of recurrent major depressive disorder (HCC)   GAD (generalized anxiety disorder)     Assessment and Plan Assessment & Plan Type 2 diabetes mellitus Well-controlled with an A1c of 6.2, improved from 6.4 last month. No need for home blood glucose monitoring as A1c is stable. -  Continue Ozempic  2 mg weekly  Essential hypertension Blood pressure was elevated at 138/104 initially but improved to 130/88. Previously well-controlled. - Continue telmisartan -hydrochlorothiazide  40-12.5 mg daily  Obesity, class 3 Obesity management is ongoing with lifestyle modifications. - Continue lifestyle modifications including a well-balanced diet and portion control  - Encourage continuation of lifestyle modifications, including dietary management and regular exercise. -continue to increase physical activity, getting at least 150 min of physical activity a week.  Work on including runner, broadcasting/film/video 2 days a week.  - continue eating at a calorie deficit 2000-2200 cal a day, eating a well balanced diet with whole foods, avoiding processed foods.   Patient is motivated to continue working on lifestyle modification.   Mixed hyperlipidemia LDL cholesterol was elevated at 147 mg/dL. -continue lifestyle modification  Depression and generalized anxiety disorder Managed with Lexapro  10 mg daily. - Continue Lexapro  10 mg daily        Follow up plan: Return in about 6 months (around 09/29/2024) for follow up.

## 2024-04-02 ENCOUNTER — Other Ambulatory Visit: Payer: Self-pay | Admitting: Nurse Practitioner

## 2024-04-02 DIAGNOSIS — I10 Essential (primary) hypertension: Secondary | ICD-10-CM

## 2024-04-03 ENCOUNTER — Other Ambulatory Visit: Payer: Self-pay

## 2024-04-03 ENCOUNTER — Other Ambulatory Visit: Payer: Self-pay | Admitting: Nurse Practitioner

## 2024-04-03 DIAGNOSIS — I10 Essential (primary) hypertension: Secondary | ICD-10-CM

## 2024-04-03 MED ORDER — TELMISARTAN-HCTZ 40-12.5 MG PO TABS
1.0000 | ORAL_TABLET | Freq: Every day | ORAL | 0 refills | Status: AC
  Filled 2024-04-03: qty 90, 90d supply, fill #0

## 2024-04-03 NOTE — Telephone Encounter (Signed)
 Requested Prescriptions  Pending Prescriptions Disp Refills   telmisartan -hydrochlorothiazide  (MICARDIS  HCT) 40-12.5 MG tablet 90 tablet 0    Sig: Take 1 tablet by mouth daily.     Cardiovascular: ARB + Diuretic Combos Passed - 04/03/2024  4:10 PM      Passed - K in normal range and within 180 days    Potassium  Date Value Ref Range Status  11/28/2023 4.6 3.5 - 5.3 mmol/L Final         Passed - Na in normal range and within 180 days    Sodium  Date Value Ref Range Status  11/28/2023 139 135 - 146 mmol/L Final         Passed - Cr in normal range and within 180 days    Creat  Date Value Ref Range Status  11/28/2023 1.22 0.60 - 1.26 mg/dL Final   Creatinine, Urine  Date Value Ref Range Status  11/28/2023 151 20 - 320 mg/dL Final         Passed - eGFR is 10 or above and within 180 days    eGFR  Date Value Ref Range Status  11/28/2023 78 > OR = 60 mL/min/1.81m2 Final         Passed - Patient is not pregnant      Passed - Last BP in normal range    BP Readings from Last 1 Encounters:  04/01/24 130/88         Passed - Valid encounter within last 6 months    Recent Outpatient Visits           2 days ago Type 2 diabetes mellitus with hyperglycemia, without long-term current use of insulin [facility])   [facility] Gareth Mliss FALCON, FNP   4 months ago Essential hypertension   [facility] Gareth Mliss FALCON, FNP   8 months ago Essential hypertension   [facility] Gareth Mliss FALCON, [place]

## 2024-05-26 ENCOUNTER — Other Ambulatory Visit: Payer: Self-pay | Admitting: Nurse Practitioner

## 2024-05-26 DIAGNOSIS — E1165 Type 2 diabetes mellitus with hyperglycemia: Secondary | ICD-10-CM

## 2024-05-28 ENCOUNTER — Other Ambulatory Visit: Payer: Self-pay

## 2024-05-28 MED ORDER — OZEMPIC (2 MG/DOSE) 8 MG/3ML ~~LOC~~ SOPN
2.0000 mg | PEN_INJECTOR | SUBCUTANEOUS | 2 refills | Status: AC
  Filled 2024-05-28: qty 3, 28d supply, fill #0

## 2024-05-28 NOTE — Telephone Encounter (Signed)
 Requested by interface surescripts. Last labs 04/01/24.  Requested Prescriptions  Pending Prescriptions Disp Refills   Semaglutide , 2 MG/DOSE, (OZEMPIC , 2 MG/DOSE,) 8 MG/3ML SOPN 3 mL 2    Sig: Inject 2 mg into the skin once a week.     Endocrinology:  Diabetes - GLP-1 Receptor Agonists - semaglutide  Failed - 05/28/2024  8:02 AM      Failed - HBA1C in normal range and within 180 days    Hemoglobin A1C  Date Value Ref Range Status  04/01/2024 6.2 (A) 4.0 - 5.6 % Final   Hgb A1c MFr Bld  Date Value Ref Range Status  11/28/2023 6.4 (H) <5.7 % Final    Comment:    For someone without known diabetes, a hemoglobin  A1c value between 5.7% and 6.4% is consistent with prediabetes and should be confirmed with a  follow-up test. . For someone with known diabetes, a value <7% indicates that their diabetes is well controlled. A1c targets should be individualized based on duration of diabetes, age, comorbid conditions, and other considerations. . This assay result is consistent with an increased risk of diabetes. . Currently, no consensus exists regarding use of hemoglobin A1c for diagnosis of diabetes for children. .          Passed - Cr in normal range and within 360 days    Creat  Date Value Ref Range Status  11/28/2023 1.22 0.60 - 1.26 mg/dL Final   Creatinine, Urine  Date Value Ref Range Status  11/28/2023 151 20 - 320 mg/dL Final         Passed - Valid encounter within last 6 months    Recent Outpatient Visits           1 month ago Type 2 diabetes mellitus with hyperglycemia, without long-term current use of insulin [facility])   [facility] Gareth Mliss FALCON, FNP   6 months ago Essential hypertension   [facility] Gareth Mliss FALCON, FNP   10 months ago Essential hypertension   [facility] Gareth Mliss FALCON, [place]

## 2024-06-14 ENCOUNTER — Other Ambulatory Visit: Payer: Self-pay

## 2024-06-14 ENCOUNTER — Ambulatory Visit: Admitting: Nurse Practitioner

## 2024-06-14 ENCOUNTER — Encounter: Payer: Self-pay | Admitting: Nurse Practitioner

## 2024-06-14 VITALS — BP 120/88 | HR 68 | Temp 98.7°F | Ht 72.0 in | Wt 315.0 lb

## 2024-06-14 DIAGNOSIS — M25511 Pain in right shoulder: Secondary | ICD-10-CM

## 2024-06-14 DIAGNOSIS — L0291 Cutaneous abscess, unspecified: Secondary | ICD-10-CM

## 2024-06-14 DIAGNOSIS — E1165 Type 2 diabetes mellitus with hyperglycemia: Secondary | ICD-10-CM

## 2024-06-14 MED ORDER — SULFAMETHOXAZOLE-TRIMETHOPRIM 800-160 MG PO TABS
1.0000 | ORAL_TABLET | Freq: Two times a day (BID) | ORAL | 0 refills | Status: AC
  Filled 2024-06-14: qty 14, 7d supply, fill #0

## 2024-06-14 MED ORDER — CELECOXIB 100 MG PO CAPS
100.0000 mg | ORAL_CAPSULE | Freq: Two times a day (BID) | ORAL | 0 refills | Status: AC
  Filled 2024-06-14: qty 20, 10d supply, fill #0
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
